# Patient Record
Sex: Male | Born: 1960
Health system: Southern US, Community
[De-identification: ages and names within clinical notes are randomized; demographics above are authoritative.]

## PROBLEM LIST (undated history)

## (undated) DIAGNOSIS — A630 Anogenital (venereal) warts: Secondary | ICD-10-CM

## (undated) DIAGNOSIS — F17200 Nicotine dependence, unspecified, uncomplicated: Secondary | ICD-10-CM

## (undated) DIAGNOSIS — I2510 Atherosclerotic heart disease of native coronary artery without angina pectoris: Secondary | ICD-10-CM

## (undated) DIAGNOSIS — Z9861 Coronary angioplasty status: Secondary | ICD-10-CM

## (undated) DIAGNOSIS — J449 Chronic obstructive pulmonary disease, unspecified: Secondary | ICD-10-CM

## (undated) DIAGNOSIS — Z6827 Body mass index (BMI) 27.0-27.9, adult: Secondary | ICD-10-CM

## (undated) DIAGNOSIS — G4733 Obstructive sleep apnea (adult) (pediatric): Secondary | ICD-10-CM

## (undated) DIAGNOSIS — E782 Mixed hyperlipidemia: Secondary | ICD-10-CM

## (undated) DIAGNOSIS — I10 Essential (primary) hypertension: Secondary | ICD-10-CM

## (undated) HISTORY — PX: NO PAST SURGERIES: SHX2092

## (undated) HISTORY — DX: Chronic obstructive pulmonary disease, unspecified: J44.9

## (undated) HISTORY — DX: Obstructive sleep apnea (adult) (pediatric): G47.33

## (undated) HISTORY — DX: Nicotine dependence, unspecified, uncomplicated: F17.200

## (undated) HISTORY — DX: Body mass index (BMI) 27.0-27.9, adult: Z68.27

## (undated) HISTORY — DX: Essential (primary) hypertension: I10

## (undated) HISTORY — DX: Anogenital (venereal) warts: A63.0

## (undated) HISTORY — DX: Atherosclerotic heart disease of native coronary artery without angina pectoris: I25.10

## (undated) HISTORY — DX: Coronary angioplasty status: Z98.61

## (undated) HISTORY — DX: Mixed hyperlipidemia: E78.2

---

## 2020-02-28 DIAGNOSIS — I2510 Atherosclerotic heart disease of native coronary artery without angina pectoris: Secondary | ICD-10-CM | POA: Diagnosis not present

## 2020-02-28 DIAGNOSIS — Z79899 Other long term (current) drug therapy: Secondary | ICD-10-CM | POA: Diagnosis not present

## 2020-02-28 DIAGNOSIS — Z9861 Coronary angioplasty status: Secondary | ICD-10-CM | POA: Diagnosis not present

## 2020-02-28 DIAGNOSIS — J449 Chronic obstructive pulmonary disease, unspecified: Secondary | ICD-10-CM | POA: Diagnosis not present

## 2020-02-28 DIAGNOSIS — E782 Mixed hyperlipidemia: Secondary | ICD-10-CM | POA: Diagnosis not present

## 2020-02-28 DIAGNOSIS — G4733 Obstructive sleep apnea (adult) (pediatric): Secondary | ICD-10-CM | POA: Diagnosis not present

## 2020-02-28 DIAGNOSIS — Z23 Encounter for immunization: Secondary | ICD-10-CM | POA: Diagnosis not present

## 2020-03-21 ENCOUNTER — Encounter: Payer: Self-pay | Admitting: General Practice

## 2020-04-13 DIAGNOSIS — A630 Anogenital (venereal) warts: Secondary | ICD-10-CM | POA: Insufficient documentation

## 2020-04-13 DIAGNOSIS — Z9861 Coronary angioplasty status: Secondary | ICD-10-CM | POA: Insufficient documentation

## 2020-04-13 DIAGNOSIS — J449 Chronic obstructive pulmonary disease, unspecified: Secondary | ICD-10-CM | POA: Insufficient documentation

## 2020-04-13 DIAGNOSIS — E782 Mixed hyperlipidemia: Secondary | ICD-10-CM | POA: Insufficient documentation

## 2020-04-13 DIAGNOSIS — I10 Essential (primary) hypertension: Secondary | ICD-10-CM | POA: Insufficient documentation

## 2020-04-13 DIAGNOSIS — F17200 Nicotine dependence, unspecified, uncomplicated: Secondary | ICD-10-CM | POA: Insufficient documentation

## 2020-04-13 DIAGNOSIS — Z6827 Body mass index (BMI) 27.0-27.9, adult: Secondary | ICD-10-CM | POA: Insufficient documentation

## 2020-04-13 DIAGNOSIS — I2510 Atherosclerotic heart disease of native coronary artery without angina pectoris: Secondary | ICD-10-CM | POA: Insufficient documentation

## 2020-04-13 DIAGNOSIS — G4733 Obstructive sleep apnea (adult) (pediatric): Secondary | ICD-10-CM | POA: Insufficient documentation

## 2020-04-14 ENCOUNTER — Other Ambulatory Visit: Payer: Self-pay

## 2020-04-14 ENCOUNTER — Encounter: Payer: Self-pay | Admitting: Cardiology

## 2020-04-14 ENCOUNTER — Ambulatory Visit (INDEPENDENT_AMBULATORY_CARE_PROVIDER_SITE_OTHER): Payer: BC Managed Care – PPO | Admitting: Cardiology

## 2020-04-14 VITALS — BP 132/90 | HR 86 | Ht 73.0 in | Wt 212.2 lb

## 2020-04-14 DIAGNOSIS — E782 Mixed hyperlipidemia: Secondary | ICD-10-CM | POA: Diagnosis not present

## 2020-04-14 DIAGNOSIS — I25119 Atherosclerotic heart disease of native coronary artery with unspecified angina pectoris: Secondary | ICD-10-CM

## 2020-04-14 DIAGNOSIS — I2510 Atherosclerotic heart disease of native coronary artery without angina pectoris: Secondary | ICD-10-CM | POA: Diagnosis not present

## 2020-04-14 DIAGNOSIS — Z9861 Coronary angioplasty status: Secondary | ICD-10-CM

## 2020-04-14 DIAGNOSIS — F17210 Nicotine dependence, cigarettes, uncomplicated: Secondary | ICD-10-CM

## 2020-04-14 DIAGNOSIS — Z6827 Body mass index (BMI) 27.0-27.9, adult: Secondary | ICD-10-CM

## 2020-04-14 DIAGNOSIS — I10 Essential (primary) hypertension: Secondary | ICD-10-CM | POA: Diagnosis not present

## 2020-04-14 DIAGNOSIS — J431 Panlobular emphysema: Secondary | ICD-10-CM

## 2020-04-14 DIAGNOSIS — Z951 Presence of aortocoronary bypass graft: Secondary | ICD-10-CM

## 2020-04-14 MED ORDER — ASPIRIN EC 81 MG PO TBEC: 81.0000 mg | DELAYED_RELEASE_TABLET | Freq: Every day | ORAL | 3 refills | Status: AC

## 2020-04-14 NOTE — Patient Instructions (Signed)
Medication Instructions:  Your physician has recommended you make the following change in your medication:   Take 81 mg aspirin daily.  *If you need a refill on your cardiac medications before your next appointment, please call your pharmacy*   Lab Work: None ordered If you have labs (blood work) drawn today and your tests are completely normal, you will receive your results only by: Marland Kitchen MyChart Message (if you have MyChart) OR . A paper copy in the mail If you have any lab test that is abnormal or we need to change your treatment, we will call you to review the results.   Testing/Procedures: Your physician has requested that you have an echocardiogram. Echocardiography is a painless test that uses sound waves to create images of your heart. It provides your doctor with information about the size and shape of your heart and how well your heart's chambers and valves are working. This procedure takes approximately one hour. There are no restrictions for this procedure.     Follow-Up: At [facility], you and your health needs are our priority.  As part of our continuing mission to provide you with exceptional heart care, we have created designated Provider Care Teams.  These Care Teams include your primary Cardiologist (physician) and Advanced Practice Providers (APPs -  Physician Assistants and Nurse Practitioners) who all work together to provide you with the care you need, when you need it.  We recommend signing up for the patient portal called "MyChart".  Sign up information is provided on this After Visit Summary.  MyChart is used to connect with patients for Virtual Visits (Telemedicine).  Patients are able to view lab/test results, encounter notes, upcoming appointments, etc.  Non-urgent messages can be sent to your provider as well.   To learn more about what you can do with MyChart, go to [email or web address].    Your next appointment:   6 month(s)  The format for your next  appointment:   In Person  Provider:   Belva Crome, MD   Other Instructions  Echocardiogram An echocardiogram is a test that uses sound waves (ultrasound) to produce images of the heart. Images from an echocardiogram can provide important information about:  Heart size and shape.  The size and thickness and movement of your heart's walls.  Heart muscle function and strength.  Heart valve function or if you have stenosis. Stenosis is when the heart valves are too narrow.  If blood is flowing backward through the heart valves (regurgitation).  A tumor or infectious growth around the heart valves.  Areas of heart muscle that are not working well because of poor blood flow or injury from a heart attack.  Aneurysm detection. An aneurysm is a weak or damaged part of an artery wall. The wall bulges out from the normal force of blood pumping through the body. Tell a health care provider about:  Any allergies you have.  All medicines you are taking, including vitamins, herbs, eye drops, creams, and over-the-counter medicines.  Any blood disorders you have.  Any surgeries you have had.  Any medical conditions you have.  Whether you are pregnant or may be pregnant. What are the risks? Generally, this is a safe test. However, problems may occur, including an allergic reaction to dye (contrast) that may be used during the test. What happens before the test? No specific preparation is needed. You may eat and drink normally. What happens during the test?  You will take off your clothes from the waist  up and put on a hospital gown.  Electrodes or electrocardiogram (ECG)patches may be placed on your chest. The electrodes or patches are then connected to a device that monitors your heart rate and rhythm.  You will lie down on a table for an ultrasound exam. A gel will be applied to your chest to help sound waves pass through your skin.  A handheld device, called a transducer,  will be pressed against your chest and moved over your heart. The transducer produces sound waves that travel to your heart and bounce back (or "echo" back) to the transducer. These sound waves will be captured in real-time and changed into images of your heart that can be viewed on a video monitor. The images will be recorded on a computer and reviewed by your health care provider.  You may be asked to change positions or hold your breath for a short time. This makes it easier to get different views or better views of your heart.  In some cases, you may receive contrast through an IV in one of your veins. This can improve the quality of the pictures from your heart. The procedure may vary among health care providers and hospitals.   What can I expect after the test? You may return to your normal, everyday life, including diet, activities, and medicines, unless your health care provider tells you not to do that. Follow these instructions at home:  It is up to you to get the results of your test. Ask your health care provider, or the department that is doing the test, when your results will be ready.  Keep all follow-up visits. This is important. Summary  An echocardiogram is a test that uses sound waves (ultrasound) to produce images of the heart.  Images from an echocardiogram can provide important information about the size and shape of your heart, heart muscle function, heart valve function, and other possible heart problems.  You do not need to do anything to prepare before this test. You may eat and drink normally.  After the echocardiogram is completed, you may return to your normal, everyday life, unless your health care provider tells you not to do that. This information is not intended to replace advice given to you by your health care provider. Make sure you discuss any questions you have with your health care provider. Document Revised: 08/24/2019 Document Reviewed:  08/24/2019 Elsevier Patient Education  2021 Elsevier Inc.    Aspirin and Your Heart Aspirin is a medicine that prevents the platelets in your blood from sticking together. Platelets are the cells that your blood uses for clotting. Aspirin can be used to help reduce the risk of blood clots, heart attacks, and other heart-related problems. What are the risks? Daily use of aspirin can cause side effects. Some of these include:  Bleeding. Bleeding can be minor or serious. An example of minor bleeding is bleeding from a cut, and the bleeding does not stop. An example of more serious bleeding is stomach bleeding or, rarely, bleeding into the brain. Your risk of bleeding increases if you are also taking NSAIDs, such as ibuprofen.  Increased bruising.  Upset stomach.  An allergic reaction. People who have growths inside the nose (nasal polyps) have an increased risk of developing an aspirin allergy. How to use aspirin to care for your heart  Take aspirin only as told by your health care provider. Make sure that you understand how much to take and what form to take. The two forms  of aspirin are: ? Non-enteric-coated.This type of aspirin does not have a coating and is absorbed quickly. This type of aspirin also comes in a chewable form. ? Enteric-coated. This type of aspirin has a coating that releases the medicine very slowly. Enteric-coated aspirin might cause less stomach upset than non-enteric-coated aspirin. This type of aspirin should not be chewed or crushed.  Work with your health care provider to find out whether it is safe and beneficial for you to take aspirin daily. Taking aspirin daily may be helpful if: ? You have had a heart attack or chest pain, or you are at risk for a heart attack. ? You have a condition in which certain heart vessels are blocked (coronary artery disease), and you have had a procedure to treat it. Examples are:  Open-heart surgery, such as coronary artery bypass  surgery (CABG).  Coronary angioplasty,which is done to widen a blood vessel of your heart.  Having a small mesh tube, or stent, placed in your coronary artery. ? You have had certain types of stroke or a mini-stroke known as a transient ischemic attack (TIA). ? You have a narrowing of the arteries that supply the limbs (peripheral artery disease, or PAD). ? You have long-term (chronic) heart rhythm problems, such as atrial fibrillation, and your health care provider thinks aspirin may help. ? You have valve disease or have had surgery on a valve. ? You are considered at increased risk of developing coronary artery disease or PAD.   Follow these instructions at home Medicines  Take over-the-counter and prescription medicines only as told by your health care provider.  If you are taking blood thinners: ? Talk with your health care provider before you take any medicines that contain aspirin or NSAIDs, such as ibuprofen. These medicines increase your risk for dangerous bleeding. ? Take your medicine exactly as told, at the same time every day. ? Avoid activities that could cause injury or bruising, and follow instructions about how to prevent falls. ? Wear a medical alert bracelet or carry a card that lists what medicines you take. General instructions  Do not drink alcohol if: ? Your health care provider tells you not to drink. ? You are pregnant, may be pregnant, or are planning to become pregnant.  If you drink alcohol: ? Limit how much you use to:  0-1 drink a day for women.  0-2 drinks a day for men. ? Be aware of how much alcohol is in your drink. In the [place] one drink equals one 12 oz bottle of beer (355 mL), one 5 oz glass of wine (148 mL), or one 1 oz glass of hard liquor (44 mL).  Keep all follow-up visits as told by your health care provider. This is important. Where to find more information  The American Heart Association: www.heart.org Contact a health care provider  if you have:  Unusual bleeding or bruising.  Stomach pain or nausea.  Ringing in your ears.  An allergic reaction that causes hives, itchy skin, or swelling of the lips, tongue, or face. Get help right away if:  You notice that your bowel movements are bloody, or dark red or black in color.  You vomit or cough up blood.  You have blood in your urine.  You cough, breathe loudly (wheeze), or feel short of breath.  You have chest pain, especially if the pain spreads to your arms, back, neck, or jaw.  You have a headache with confusion. You have any symptoms of  a stroke. "BE FAST" is an easy way to remember the main warning signs of a stroke:  B - Balance. Signs are dizziness, sudden trouble walking, or loss of balance.  E - Eyes. Signs are trouble seeing or a sudden change in vision.  F - Face. Signs are sudden weakness or numbness of the face, or the face or eyelid drooping on one side.  A - Arms. Signs are weakness or numbness in an arm. This happens suddenly and usually on one side of the body.  S - Speech. Signs are sudden trouble speaking, slurred speech, or trouble understanding what people say.  T - Time. Time to call emergency services. Write down what time symptoms started. You have other signs of a stroke, such as:  A sudden, severe headache with no known cause.  Nausea or vomiting.  Seizure. These symptoms may represent a serious problem that is an emergency. Do not wait to see if the symptoms will go away. Get medical help right away. Call your local emergency services (911 in the [place] Do not drive yourself to the hospital. Summary  Aspirin use can help reduce the risk of blood clots, heart attacks, and other heart-related problems.  Daily use of aspirin can cause side effects.  Take aspirin only as told by your health care provider. Make sure that you understand how much to take and what form to take.  Your health care provider will help you determine  whether it is safe and beneficial for you to take aspirin daily. This information is not intended to replace advice given to you by your health care provider. Make sure you discuss any questions you have with your health care provider. Document Revised: 10/05/2018 Document Reviewed: 10/05/2018 Elsevier Patient Education  2021 [employer]

## 2020-04-14 NOTE — Progress Notes (Signed)
Cardiology Office Note:    Date:  04/14/2020   ID:  Ryan Shaffer, DOB [birth year], MRN [id number]  PCP:  Julianne Handler, NP  Cardiologist:  Garwin Brothers, MD   Referring MD: Julianne Handler, NP    ASSESSMENT:    1. Coronary artery disease involving native heart with angina pectoris, unspecified vessel or lesion type (HCC)   2. Benign essential hypertension   3. Coronary artery disease with history of coronary revascularization   4. Mixed hyperlipidemia   5. BMI 27.0-27.9,adult   6. Panlobular emphysema (HCC)   7. Cigarette smoker   8. Hx of CABG    PLAN:    In order of problems listed above:  1. Coronary artery disease post CABG surgery: Secondary prevention stressed with the patient.  Importance of compliance with diet medication stressed any vocalized understanding.  Was advised to take a coated aspirin on a daily basis.  He was advised to take half an hour of exercise of walking and aerobic exercises every day 5 days a week and he promises to do so.  I will try to get his records from [place] where he was seen by his cardiologist.  He does have nitroglycerin and understands its protocol.  He has never used it.  Echocardiogram will be done to assess murmur heard on auscultation and his cardiac anatomy. 2. Cigarette smoker: I spent 5 minutes with the patient discussing solely about smoking. Smoking cessation was counseled. I suggested to the patient also different medications and pharmacological interventions. Patient is keen to try stopping on its own at this time. He will get back to me if he needs any further assistance in this matter. 3. Essential hypertension: Blood pressure stable and diet was emphasized.  Lifestyle modification urged. 4. Mixed dyslipidemia: Lipids were reviewed and they are followed by primary care.  I reviewed and discussed findings with the patient at length. 5. Sleep apnea: Sleep health issues were discussed.  Patient also gives history of COPD and smoking and  related issues were discussed and needs lipids and health were discussed extensively and he promises to quit. 6. Patient will be seen in follow-up appointment in 6 months or earlier if the patient has any concerns    Medication Adjustments/Labs and Tests Ordered: Current medicines are reviewed at length with the patient today.  Concerns regarding medicines are outlined above.  Orders Placed This Encounter  Procedures  . EKG 12-Lead  . ECHOCARDIOGRAM COMPLETE   Meds ordered this encounter  Medications  . aspirin EC 81 MG tablet    Sig: Take 1 tablet (81 mg total) by mouth daily. Swallow whole.    Dispense:  90 tablet    Refill:  3     History of Present Illness:    Ryan Shaffer is a 60 y.o. male who is being seen today for the evaluation of coronary artery disease to be established at the request of Julianne Handler, NP.  Patient is a pleasant 60 year old male.  He has past medical history of coronary artery disease post CABG surgery, essential hypertension, dyslipidemia and unfortunately smokes heavily.  He has had bypass surgery 6 years ago and has moved from [place] or here.  He denies any chest pain orthopnea or PND.  He takes care of activities of daily living.  He does not exercise on a regular basis.  At the time of my evaluation, the patient is alert awake oriented and in no distress.  Past Medical History:  Diagnosis Date  .  Benign essential hypertension   . BMI 27.0-27.9,adult   . CAD (coronary artery disease)   . COPD (chronic obstructive pulmonary disease) (HCC)   . Coronary artery disease with history of coronary revascularization   . Genital warts   . Mixed hyperlipidemia   . Nicotine dependence   . Obstructive sleep apnea, adult     Past Surgical History:  Procedure Laterality Date  . NO PAST SURGERIES      Current Medications: Current Meds  Medication Sig  . albuterol (VENTOLIN HFA) 108 (90 Base) MCG/ACT inhaler Inhale 2 puffs into the lungs every 4 (four)  hours as needed for wheezing or shortness of breath.  Marland Kitchen aspirin EC 81 MG tablet Take 1 tablet (81 mg total) by mouth daily. Swallow whole.  Marland Kitchen atorvastatin (LIPITOR) 80 MG tablet Take 80 mg by mouth daily.  . budesonide-formoterol (SYMBICORT) 160-4.5 MCG/ACT inhaler Inhale 1 puff into the lungs 2 (two) times daily.  . carvedilol (COREG) 6.25 MG tablet Take 6.25 mg by mouth 2 (two) times daily with a meal.  . imiquimod (ALDARA) 5 % cream Apply 1 application topically 3 (three) times a week.  Marland Kitchen lisinopril (ZESTRIL) 2.5 MG tablet Take 2.5 mg by mouth daily.  . nitroGLYCERIN (NITROSTAT) 0.4 MG SL tablet Place 0.4 mg under the tongue every 5 (five) minutes as needed for chest pain.  . [DISCONTINUED] Albuterol Sulfate, sensor, 108 (90 Base) MCG/ACT AEPB Inhale 1 puff into the lungs as needed for wheezing or shortness of breath.     Allergies:   Patient has no known allergies.   Social History   Socioeconomic History  . Marital status: Married    Spouse name: Not on file  . Number of children: Not on file  . Years of education: Not on file  . Highest education level: Not on file  Occupational History  . Not on file  Tobacco Use  . Smoking status: Current Every Day Smoker  . Smokeless tobacco: Never Used  Substance and Sexual Activity  . Alcohol use: Yes    Alcohol/week: 5.0 - 10.0 standard drinks    Types: 5 - 10 Cans of beer per week  . Drug use: Not on file  . Sexual activity: Not on file  Other Topics Concern  . Not on file  Social History Narrative  . Not on file   Social Determinants of Health   Financial Resource Strain: Not on file  Food Insecurity: Not on file  Transportation Needs: Not on file  Physical Activity: Not on file  Stress: Not on file  Social Connections: Not on file     Family History: The patient's family history includes Heart disease in his father; Hypertension in his father; Stroke in his father.  ROS:   Please see the history of present illness.     All other systems reviewed and are negative.  EKGs/Labs/Other Studies Reviewed:    The following studies were reviewed today: EKG reveals sinus rhythm and nonspecific ST-T changes   Recent Labs: No results found for requested labs within last 8760 hours.  Recent Lipid Panel No results found for: CHOL, TRIG, HDL, CHOLHDL, VLDL, LDLCALC, LDLDIRECT  Physical Exam:    VS:  BP 132/90   Pulse 86   Ht 6\' 1"  (1.854 m)   Wt 212 lb 3.2 oz (96.3 kg)   SpO2 98%   BMI 28.00 kg/m     Wt Readings from Last 3 Encounters:  04/14/20 212 lb 3.2 oz (96.3 kg)  GEN: Patient is in no acute distress HEENT: Normal NECK: No JVD; No carotid bruits LYMPHATICS: No lymphadenopathy CARDIAC: S1 S2 regular, 2/6 systolic murmur at the apex. RESPIRATORY:  Clear to auscultation without rales, wheezing or rhonchi  ABDOMEN: Soft, non-tender, non-distended MUSCULOSKELETAL:  No edema; No deformity  SKIN: Warm and dry NEUROLOGIC:  Alert and oriented x 3 PSYCHIATRIC:  Normal affect    Signed, Garwin Brothers, MD  04/14/2020 9:23 AM    [facility]

## 2020-05-10 ENCOUNTER — Other Ambulatory Visit: Payer: BC Managed Care – PPO

## 2020-06-07 ENCOUNTER — Other Ambulatory Visit: Payer: BC Managed Care – PPO

## 2020-08-09 DIAGNOSIS — G4733 Obstructive sleep apnea (adult) (pediatric): Secondary | ICD-10-CM | POA: Diagnosis not present

## 2020-08-19 DIAGNOSIS — G4733 Obstructive sleep apnea (adult) (pediatric): Secondary | ICD-10-CM | POA: Diagnosis not present

## 2020-08-19 DIAGNOSIS — R0602 Shortness of breath: Secondary | ICD-10-CM | POA: Diagnosis not present

## 2020-08-20 DIAGNOSIS — G4733 Obstructive sleep apnea (adult) (pediatric): Secondary | ICD-10-CM | POA: Diagnosis not present

## 2020-08-20 DIAGNOSIS — R0602 Shortness of breath: Secondary | ICD-10-CM | POA: Diagnosis not present

## 2020-09-09 DIAGNOSIS — G4733 Obstructive sleep apnea (adult) (pediatric): Secondary | ICD-10-CM | POA: Diagnosis not present

## 2020-09-12 DIAGNOSIS — I252 Old myocardial infarction: Secondary | ICD-10-CM | POA: Diagnosis not present

## 2020-09-12 DIAGNOSIS — J449 Chronic obstructive pulmonary disease, unspecified: Secondary | ICD-10-CM | POA: Diagnosis not present

## 2020-09-12 DIAGNOSIS — I10 Essential (primary) hypertension: Secondary | ICD-10-CM | POA: Diagnosis not present

## 2020-09-12 DIAGNOSIS — E782 Mixed hyperlipidemia: Secondary | ICD-10-CM | POA: Diagnosis not present

## 2020-10-10 DIAGNOSIS — G4733 Obstructive sleep apnea (adult) (pediatric): Secondary | ICD-10-CM | POA: Diagnosis not present

## 2020-11-09 DIAGNOSIS — G4733 Obstructive sleep apnea (adult) (pediatric): Secondary | ICD-10-CM | POA: Diagnosis not present

## 2020-12-10 DIAGNOSIS — G4733 Obstructive sleep apnea (adult) (pediatric): Secondary | ICD-10-CM | POA: Diagnosis not present

## 2021-01-09 DIAGNOSIS — G4733 Obstructive sleep apnea (adult) (pediatric): Secondary | ICD-10-CM | POA: Diagnosis not present

## 2021-01-26 DIAGNOSIS — I2510 Atherosclerotic heart disease of native coronary artery without angina pectoris: Secondary | ICD-10-CM | POA: Diagnosis not present

## 2021-01-26 DIAGNOSIS — Z131 Encounter for screening for diabetes mellitus: Secondary | ICD-10-CM | POA: Diagnosis not present

## 2021-01-26 DIAGNOSIS — I10 Essential (primary) hypertension: Secondary | ICD-10-CM | POA: Diagnosis not present

## 2021-01-26 DIAGNOSIS — I252 Old myocardial infarction: Secondary | ICD-10-CM | POA: Diagnosis not present

## 2021-01-26 DIAGNOSIS — E785 Hyperlipidemia, unspecified: Secondary | ICD-10-CM | POA: Diagnosis not present

## 2021-01-26 DIAGNOSIS — Z1331 Encounter for screening for depression: Secondary | ICD-10-CM | POA: Diagnosis not present

## 2021-01-26 DIAGNOSIS — Z0000 Encounter for general adult medical examination without abnormal findings: Secondary | ICD-10-CM | POA: Diagnosis not present

## 2021-01-26 DIAGNOSIS — Z87891 Personal history of nicotine dependence: Secondary | ICD-10-CM | POA: Diagnosis not present

## 2021-01-26 DIAGNOSIS — J069 Acute upper respiratory infection, unspecified: Secondary | ICD-10-CM | POA: Diagnosis not present

## 2021-01-26 DIAGNOSIS — Z6826 Body mass index (BMI) 26.0-26.9, adult: Secondary | ICD-10-CM | POA: Diagnosis not present

## 2021-02-06 DIAGNOSIS — I252 Old myocardial infarction: Secondary | ICD-10-CM | POA: Diagnosis not present

## 2021-02-06 DIAGNOSIS — J449 Chronic obstructive pulmonary disease, unspecified: Secondary | ICD-10-CM | POA: Diagnosis not present

## 2021-02-06 DIAGNOSIS — I10 Essential (primary) hypertension: Secondary | ICD-10-CM | POA: Diagnosis not present

## 2021-02-06 DIAGNOSIS — E785 Hyperlipidemia, unspecified: Secondary | ICD-10-CM | POA: Diagnosis not present

## 2021-02-20 DIAGNOSIS — J441 Chronic obstructive pulmonary disease with (acute) exacerbation: Secondary | ICD-10-CM | POA: Diagnosis not present

## 2021-02-20 DIAGNOSIS — J069 Acute upper respiratory infection, unspecified: Secondary | ICD-10-CM | POA: Diagnosis not present

## 2021-02-27 DIAGNOSIS — J449 Chronic obstructive pulmonary disease, unspecified: Secondary | ICD-10-CM | POA: Diagnosis not present

## 2021-02-27 DIAGNOSIS — J441 Chronic obstructive pulmonary disease with (acute) exacerbation: Secondary | ICD-10-CM | POA: Diagnosis not present

## 2021-02-27 DIAGNOSIS — F17200 Nicotine dependence, unspecified, uncomplicated: Secondary | ICD-10-CM | POA: Diagnosis not present

## 2021-02-27 DIAGNOSIS — J069 Acute upper respiratory infection, unspecified: Secondary | ICD-10-CM | POA: Diagnosis not present

## 2021-03-19 DIAGNOSIS — J9811 Atelectasis: Secondary | ICD-10-CM | POA: Diagnosis not present

## 2021-03-19 DIAGNOSIS — R918 Other nonspecific abnormal finding of lung field: Secondary | ICD-10-CM | POA: Diagnosis not present

## 2021-03-19 DIAGNOSIS — I700 Atherosclerosis of aorta: Secondary | ICD-10-CM | POA: Diagnosis not present

## 2021-03-19 DIAGNOSIS — R0602 Shortness of breath: Secondary | ICD-10-CM | POA: Diagnosis not present

## 2021-04-19 ENCOUNTER — Other Ambulatory Visit: Payer: Self-pay | Admitting: Internal Medicine

## 2021-04-20 ENCOUNTER — Other Ambulatory Visit: Payer: Self-pay | Admitting: Internal Medicine

## 2021-04-20 DIAGNOSIS — R9389 Abnormal findings on diagnostic imaging of other specified body structures: Secondary | ICD-10-CM

## 2021-04-25 ENCOUNTER — Other Ambulatory Visit: Payer: BC Managed Care – PPO

## 2021-04-25 ENCOUNTER — Ambulatory Visit
Admission: RE | Admit: 2021-04-25 | Discharge: 2021-04-25 | Disposition: A | Discharge status: Home / Self Care | Payer: BC Managed Care – PPO | Source: Ambulatory Visit | Attending: Internal Medicine | Admitting: Internal Medicine

## 2021-04-25 DIAGNOSIS — R9389 Abnormal findings on diagnostic imaging of other specified body structures: Secondary | ICD-10-CM

## 2021-04-25 DIAGNOSIS — K8020 Calculus of gallbladder without cholecystitis without obstruction: Secondary | ICD-10-CM | POA: Diagnosis not present

## 2021-04-25 DIAGNOSIS — K7689 Other specified diseases of liver: Secondary | ICD-10-CM | POA: Diagnosis not present

## 2021-04-25 MED ORDER — GADOBENATE DIMEGLUMINE 529 MG/ML IV SOLN
19.0000 mL | Freq: Once | INTRAVENOUS | Status: AC | PRN
  Administered 2021-04-25: 19 mL via INTRAVENOUS

## 2021-04-27 ENCOUNTER — Other Ambulatory Visit: Payer: BC Managed Care – PPO

## 2021-05-22 DIAGNOSIS — R935 Abnormal findings on diagnostic imaging of other abdominal regions, including retroperitoneum: Secondary | ICD-10-CM | POA: Diagnosis not present

## 2021-05-22 DIAGNOSIS — K8020 Calculus of gallbladder without cholecystitis without obstruction: Secondary | ICD-10-CM | POA: Diagnosis not present

## 2021-05-22 DIAGNOSIS — R350 Frequency of micturition: Secondary | ICD-10-CM | POA: Diagnosis not present

## 2021-05-22 DIAGNOSIS — J449 Chronic obstructive pulmonary disease, unspecified: Secondary | ICD-10-CM | POA: Diagnosis not present

## 2021-09-14 DIAGNOSIS — R350 Frequency of micturition: Secondary | ICD-10-CM | POA: Diagnosis not present

## 2021-09-14 DIAGNOSIS — R300 Dysuria: Secondary | ICD-10-CM | POA: Diagnosis not present

## 2021-09-21 DIAGNOSIS — R972 Elevated prostate specific antigen [PSA]: Secondary | ICD-10-CM | POA: Diagnosis not present

## 2021-09-21 DIAGNOSIS — N401 Enlarged prostate with lower urinary tract symptoms: Secondary | ICD-10-CM | POA: Diagnosis not present

## 2021-09-21 DIAGNOSIS — N410 Acute prostatitis: Secondary | ICD-10-CM | POA: Diagnosis not present

## 2021-09-21 DIAGNOSIS — J449 Chronic obstructive pulmonary disease, unspecified: Secondary | ICD-10-CM | POA: Diagnosis not present

## 2021-09-21 DIAGNOSIS — N453 Epididymo-orchitis: Secondary | ICD-10-CM | POA: Diagnosis not present

## 2021-09-21 DIAGNOSIS — I10 Essential (primary) hypertension: Secondary | ICD-10-CM | POA: Diagnosis not present

## 2021-09-21 DIAGNOSIS — N400 Enlarged prostate without lower urinary tract symptoms: Secondary | ICD-10-CM | POA: Diagnosis not present

## 2021-10-24 ENCOUNTER — Emergency Department (HOSPITAL_COMMUNITY): Payer: BC Managed Care – PPO

## 2021-10-24 ENCOUNTER — Encounter (HOSPITAL_COMMUNITY): Payer: Self-pay

## 2021-10-24 ENCOUNTER — Other Ambulatory Visit: Payer: Self-pay

## 2021-10-24 ENCOUNTER — Emergency Department (HOSPITAL_COMMUNITY)
Admission: EM | Admit: 2021-10-24 | Discharge: 2021-10-24 | Discharge status: Against Medical Advice | Payer: BC Managed Care – PPO | Attending: Emergency Medicine | Admitting: Emergency Medicine

## 2021-10-24 DIAGNOSIS — Z5321 Procedure and treatment not carried out due to patient leaving prior to being seen by health care provider: Secondary | ICD-10-CM | POA: Diagnosis not present

## 2021-10-24 DIAGNOSIS — R0789 Other chest pain: Secondary | ICD-10-CM | POA: Diagnosis not present

## 2021-10-24 DIAGNOSIS — Z7982 Long term (current) use of aspirin: Secondary | ICD-10-CM | POA: Insufficient documentation

## 2021-10-24 DIAGNOSIS — R079 Chest pain, unspecified: Secondary | ICD-10-CM | POA: Insufficient documentation

## 2021-10-24 DIAGNOSIS — J9811 Atelectasis: Secondary | ICD-10-CM | POA: Diagnosis not present

## 2021-10-24 LAB — BASIC METABOLIC PANEL
Anion gap: 7 (ref 5–15)
BUN: 21 mg/dL — ABNORMAL HIGH (ref 6–20)
CO2: 26 mmol/L (ref 22–32)
Calcium: 9 mg/dL (ref 8.9–10.3)
Chloride: 106 mmol/L (ref 98–111)
Creatinine, Ser: 1.03 mg/dL (ref 0.61–1.24)
GFR, Estimated: >60 mL/min (ref >60)
Glucose, Bld: 97 mg/dL (ref 70–99)
Potassium: 4.4 mmol/L (ref 3.5–5.1)
Sodium: 139 mmol/L (ref 135–145)

## 2021-10-24 LAB — CBC
HCT: 39.4 % (ref 39.0–52.0)
Hemoglobin: 13.3 g/dL (ref 13.0–17.0)
MCH: 30.8 pg (ref 26.0–34.0)
MCHC: 33.8 g/dL (ref 30.0–36.0)
MCV: 91.2 fL (ref 80.0–100.0)
Platelets: 213 10*3/uL (ref 150–400)
RBC: 4.32 MIL/uL (ref 4.22–5.81)
RDW: 13.8 % (ref 11.5–15.5)
WBC: 9.7 10*3/uL (ref 4.0–10.5)
nRBC: 0 % (ref 0.0–0.2)

## 2021-10-24 LAB — TROPONIN I (HIGH SENSITIVITY): Troponin I (High Sensitivity): 4 ng/L (ref <18)

## 2021-10-24 NOTE — ED Triage Notes (Signed)
Complains of chest pains when he was on the way to work.  Took nitro went away and then came back.  Patient has cardiac hx.  No radiation Denies sob n/v

## 2021-10-24 NOTE — ED Provider Triage Note (Signed)
Emergency Medicine Provider Triage Evaluation Note  Johnn Krasowski , a 61 y.o. male  was evaluated in triage.  Pt complains of chest pain onset on the way to work today.  He has had a total of 2 episodes of chest pain each lasting around 10 minutes and resolving with nitroglycerin.  He took 1 baby aspirin this morning and he was given additional 3 by EMS.  Patient reports a history of stent placement x2  Review of Systems  Positive: Chest pain Negative: Nausea vomiting, extremity swelling, or any additional concerns  Physical Exam  BP 115/80 (BP Location: Right Arm)   Pulse 79   Temp 97.9 F (36.6 C)   Resp 16   Ht 6\' 1"  (1.854 m)   Wt 90.7 kg   SpO2 100%   BMI 26.39 kg/m  Gen:   Awake, no distress   Resp:  Normal effort, lungs clear MSK:   Moves extremities without difficulty, no edema Other:  Heart regular rate and rhythm  Medical Decision Making  Medically screening exam initiated at 10:37 AM.  Appropriate orders placed.  Fredrico Beedle was informed that the remainder of the evaluation will be completed by another provider, this initial triage assessment does not replace that evaluation, and the importance of remaining in the ED until their evaluation is complete.    Note: Portions of this report may have been transcribed using voice recognition software. Every effort was made to ensure accuracy; however, inadvertent computerized transcription errors may still be present.    Deliah Boston, PA-C 10/24/21 1136

## 2021-10-24 NOTE — ED Notes (Signed)
Pt stated that he was leaving, he could not wait and he looked at his mychart. IV removed.

## 2021-10-26 DIAGNOSIS — N401 Enlarged prostate with lower urinary tract symptoms: Secondary | ICD-10-CM | POA: Diagnosis not present

## 2021-10-26 DIAGNOSIS — R972 Elevated prostate specific antigen [PSA]: Secondary | ICD-10-CM | POA: Diagnosis not present

## 2021-10-26 DIAGNOSIS — N453 Epididymo-orchitis: Secondary | ICD-10-CM | POA: Diagnosis not present

## 2021-12-13 ENCOUNTER — Other Ambulatory Visit: Payer: Self-pay

## 2021-12-17 ENCOUNTER — Other Ambulatory Visit: Payer: Self-pay

## 2021-12-17 ENCOUNTER — Encounter: Payer: Self-pay | Admitting: Internal Medicine

## 2021-12-17 ENCOUNTER — Ambulatory Visit (INDEPENDENT_AMBULATORY_CARE_PROVIDER_SITE_OTHER): Payer: BC Managed Care – PPO | Admitting: Internal Medicine

## 2021-12-17 VITALS — BP 130/90 | HR 77 | Temp 97.7°F | Resp 18 | Ht 73.0 in | Wt 214.1 lb

## 2021-12-17 DIAGNOSIS — E782 Mixed hyperlipidemia: Secondary | ICD-10-CM | POA: Diagnosis not present

## 2021-12-17 DIAGNOSIS — I10 Essential (primary) hypertension: Secondary | ICD-10-CM | POA: Diagnosis not present

## 2021-12-17 DIAGNOSIS — I2510 Atherosclerotic heart disease of native coronary artery without angina pectoris: Secondary | ICD-10-CM | POA: Diagnosis not present

## 2021-12-17 DIAGNOSIS — R5383 Other fatigue: Secondary | ICD-10-CM | POA: Insufficient documentation

## 2021-12-17 DIAGNOSIS — Z9861 Coronary angioplasty status: Secondary | ICD-10-CM

## 2021-12-17 MED ORDER — NITROGLYCERIN 0.4 MG SL SUBL: 0.4000 mg | SUBLINGUAL_TABLET | SUBLINGUAL | 2 refills | Status: AC | PRN

## 2021-12-17 MED ORDER — CARVEDILOL 6.25 MG PO TABS: 6.2500 mg | ORAL_TABLET | Freq: Two times a day (BID) | ORAL | 2 refills | Status: DC

## 2021-12-17 MED ORDER — MONTELUKAST SODIUM 10 MG PO TABS: 10.0000 mg | ORAL_TABLET | Freq: Every day | ORAL | 2 refills | Status: DC

## 2021-12-17 MED ORDER — TRAZODONE HCL 100 MG PO TABS: 100.0000 mg | ORAL_TABLET | Freq: Every day | ORAL | 3 refills | Status: DC

## 2021-12-17 MED ORDER — LISINOPRIL 2.5 MG PO TABS: 2.5000 mg | ORAL_TABLET | Freq: Every day | ORAL | 3 refills | Status: DC

## 2021-12-17 NOTE — Progress Notes (Signed)
   Established Patient Office Visit  Subjective   Patient ID: Ryan Shaffer, male    DOB: 1960-12-01  Age: 61 y.o. MRN: [id number]  Chief Complaint  Patient presents with   Follow-up   Hypertension    Go over Blood Pressure Medication    Hypertension The current episode started more than 1 year ago. Associated symptoms include malaise/fatigue. Risk factors for coronary artery disease include male gender and dyslipidemia.      Review of Systems  Constitutional:  Positive for malaise/fatigue.  Respiratory:  Positive for cough and sputum production.   Cardiovascular: Negative.   Gastrointestinal: Negative.   Neurological: Negative.       Objective:     BP (!) 130/90 (BP Location: Left Arm, Patient Position: Sitting, Cuff Size: Normal)   Pulse 77   Temp 97.7 F (36.5 C)   Resp 18   Ht 6\' 1"  (1.854 m)   Wt 214 lb 2 oz (97.1 kg)   SpO2 97%   BMI 28.25 kg/m    Physical Exam HENT:     Head: Normocephalic and atraumatic.  Eyes:     Extraocular Movements: Extraocular movements intact.     Pupils: Pupils are equal, round, and reactive to light.  Cardiovascular:     Rate and Rhythm: Regular rhythm.     Pulses: Normal pulses.     Heart sounds: Normal heart sounds.  Pulmonary:     Effort: Pulmonary effort is normal.     Breath sounds: Normal breath sounds.  Abdominal:     General: Bowel sounds are normal.     Palpations: Abdomen is soft.  Musculoskeletal:     Cervical back: Neck supple.  Neurological:     General: No focal deficit present.     Mental Status: He is alert and oriented to person, place, and time.      No results found for any visits on 12/17/21.    The ASCVD Risk score (Arnett DK, et al., 2019) failed to calculate for the following reasons:   Cannot find a previous HDL lab   Cannot find a previous total cholesterol lab    Assessment & Plan:   Problem List Items Addressed This Visit   None   No follow-ups on file.    2020, MD

## 2021-12-24 DIAGNOSIS — R55 Syncope and collapse: Secondary | ICD-10-CM | POA: Diagnosis not present

## 2021-12-24 DIAGNOSIS — N281 Cyst of kidney, acquired: Secondary | ICD-10-CM | POA: Diagnosis not present

## 2021-12-24 DIAGNOSIS — N451 Epididymitis: Secondary | ICD-10-CM | POA: Diagnosis not present

## 2021-12-24 DIAGNOSIS — N410 Acute prostatitis: Secondary | ICD-10-CM | POA: Diagnosis not present

## 2021-12-24 DIAGNOSIS — R319 Hematuria, unspecified: Secondary | ICD-10-CM | POA: Diagnosis not present

## 2021-12-24 DIAGNOSIS — K8020 Calculus of gallbladder without cholecystitis without obstruction: Secondary | ICD-10-CM | POA: Diagnosis not present

## 2021-12-24 DIAGNOSIS — N50811 Right testicular pain: Secondary | ICD-10-CM | POA: Diagnosis not present

## 2021-12-24 DIAGNOSIS — R519 Headache, unspecified: Secondary | ICD-10-CM | POA: Diagnosis not present

## 2021-12-24 DIAGNOSIS — N3001 Acute cystitis with hematuria: Secondary | ICD-10-CM | POA: Diagnosis not present

## 2021-12-24 DIAGNOSIS — N503 Cyst of epididymis: Secondary | ICD-10-CM | POA: Diagnosis not present

## 2021-12-24 DIAGNOSIS — N433 Hydrocele, unspecified: Secondary | ICD-10-CM | POA: Diagnosis not present

## 2021-12-24 DIAGNOSIS — N5089 Other specified disorders of the male genital organs: Secondary | ICD-10-CM | POA: Diagnosis not present

## 2021-12-24 DIAGNOSIS — R410 Disorientation, unspecified: Secondary | ICD-10-CM | POA: Diagnosis not present

## 2021-12-25 ENCOUNTER — Other Ambulatory Visit: Payer: Self-pay | Admitting: Internal Medicine

## 2022-01-09 ENCOUNTER — Other Ambulatory Visit: Payer: Self-pay | Admitting: Internal Medicine

## 2022-01-21 DIAGNOSIS — J449 Chronic obstructive pulmonary disease, unspecified: Secondary | ICD-10-CM | POA: Diagnosis not present

## 2022-01-21 DIAGNOSIS — Z20822 Contact with and (suspected) exposure to covid-19: Secondary | ICD-10-CM | POA: Diagnosis not present

## 2022-01-21 DIAGNOSIS — J069 Acute upper respiratory infection, unspecified: Secondary | ICD-10-CM | POA: Diagnosis not present

## 2022-02-11 DIAGNOSIS — L820 Inflamed seborrheic keratosis: Secondary | ICD-10-CM | POA: Diagnosis not present

## 2022-02-14 DIAGNOSIS — N401 Enlarged prostate with lower urinary tract symptoms: Secondary | ICD-10-CM | POA: Diagnosis not present

## 2022-02-14 DIAGNOSIS — R972 Elevated prostate specific antigen [PSA]: Secondary | ICD-10-CM | POA: Diagnosis not present

## 2022-02-14 DIAGNOSIS — C61 Malignant neoplasm of prostate: Secondary | ICD-10-CM | POA: Diagnosis not present

## 2022-02-14 DIAGNOSIS — R310 Gross hematuria: Secondary | ICD-10-CM | POA: Diagnosis not present

## 2022-03-14 ENCOUNTER — Other Ambulatory Visit: Payer: Self-pay | Admitting: Internal Medicine

## 2022-03-15 ENCOUNTER — Other Ambulatory Visit: Payer: Self-pay | Admitting: Internal Medicine

## 2022-03-22 ENCOUNTER — Ambulatory Visit: Payer: BC Managed Care – PPO | Admitting: Internal Medicine

## 2022-04-23 DIAGNOSIS — C61 Malignant neoplasm of prostate: Secondary | ICD-10-CM | POA: Diagnosis not present

## 2022-04-23 DIAGNOSIS — R972 Elevated prostate specific antigen [PSA]: Secondary | ICD-10-CM | POA: Diagnosis not present

## 2022-04-23 DIAGNOSIS — N401 Enlarged prostate with lower urinary tract symptoms: Secondary | ICD-10-CM | POA: Diagnosis not present

## 2022-04-29 DIAGNOSIS — C61 Malignant neoplasm of prostate: Secondary | ICD-10-CM | POA: Diagnosis not present

## 2022-04-29 DIAGNOSIS — N401 Enlarged prostate with lower urinary tract symptoms: Secondary | ICD-10-CM | POA: Diagnosis not present

## 2022-04-29 DIAGNOSIS — R972 Elevated prostate specific antigen [PSA]: Secondary | ICD-10-CM | POA: Diagnosis not present

## 2022-05-07 DIAGNOSIS — C61 Malignant neoplasm of prostate: Secondary | ICD-10-CM | POA: Diagnosis not present

## 2022-05-07 DIAGNOSIS — N281 Cyst of kidney, acquired: Secondary | ICD-10-CM | POA: Diagnosis not present

## 2022-05-07 DIAGNOSIS — I700 Atherosclerosis of aorta: Secondary | ICD-10-CM | POA: Diagnosis not present

## 2022-05-07 DIAGNOSIS — K8020 Calculus of gallbladder without cholecystitis without obstruction: Secondary | ICD-10-CM | POA: Diagnosis not present

## 2022-05-07 DIAGNOSIS — I2510 Atherosclerotic heart disease of native coronary artery without angina pectoris: Secondary | ICD-10-CM | POA: Diagnosis not present

## 2022-05-20 DIAGNOSIS — R310 Gross hematuria: Secondary | ICD-10-CM | POA: Diagnosis not present

## 2022-05-20 DIAGNOSIS — C61 Malignant neoplasm of prostate: Secondary | ICD-10-CM | POA: Diagnosis not present

## 2022-05-20 DIAGNOSIS — N401 Enlarged prostate with lower urinary tract symptoms: Secondary | ICD-10-CM | POA: Diagnosis not present

## 2022-05-20 DIAGNOSIS — R972 Elevated prostate specific antigen [PSA]: Secondary | ICD-10-CM | POA: Diagnosis not present

## 2022-07-17 ENCOUNTER — Encounter: Payer: Self-pay | Admitting: Internal Medicine

## 2022-07-17 ENCOUNTER — Ambulatory Visit: Payer: BC Managed Care – PPO | Admitting: Internal Medicine

## 2022-07-17 VITALS — BP 120/88 | HR 88 | Temp 98.4°F | Resp 16 | Ht 72.0 in | Wt 225.4 lb

## 2022-07-17 DIAGNOSIS — E7800 Pure hypercholesterolemia, unspecified: Secondary | ICD-10-CM

## 2022-07-17 DIAGNOSIS — J449 Chronic obstructive pulmonary disease, unspecified: Secondary | ICD-10-CM | POA: Diagnosis not present

## 2022-07-17 DIAGNOSIS — G4733 Obstructive sleep apnea (adult) (pediatric): Secondary | ICD-10-CM

## 2022-07-17 DIAGNOSIS — I10 Essential (primary) hypertension: Secondary | ICD-10-CM

## 2022-07-17 DIAGNOSIS — I2510 Atherosclerotic heart disease of native coronary artery without angina pectoris: Secondary | ICD-10-CM

## 2022-07-17 MED ORDER — PANTOPRAZOLE SODIUM 40 MG PO TBEC: 40.0000 mg | DELAYED_RELEASE_TABLET | Freq: Every day | ORAL | 3 refills | Status: DC

## 2022-07-17 NOTE — Assessment & Plan Note (Signed)
He is not able to tolerate his sleep mask or cannula.  I want him to work on his weight.

## 2022-07-17 NOTE — Assessment & Plan Note (Signed)
He denies any angina and his CAD is stable.  We will continue risk factor modification.  Thankfully he stopped smoking and I congratulated him.  He never went for the ECHO in 04/2020 and I am going to refer him back to cardiology.  Continue on his current meds including his ASA.

## 2022-07-17 NOTE — Progress Notes (Signed)
Office Visit  Subjective   Patient ID: Ryan Shaffer   DOB: October 16, 1960   Age: 62 y.o.   MRN: [id number]   Chief Complaint Chief Complaint  Patient presents with   Follow-up     History of Present Illness The patient is a 62 year old Caucasian/White male who returns for followup of his COPD.  He states that he was diagnosed 7-8 years by [facility].  He has a history of tobacco abuse where he started smoking at age 25 and smoked about 1 ppd.  He did start cutting back where 1 year ago he was smoking maybe 2 cigarettes per day.  He quit smoking in May and is currently on a nicotine patch.   He is currently on symbicort inhaler which he is supposed to take twice a day but he takes it once per day.  He is also on albuterol which he may use 2-3 times per week.  He has no baseline symptoms of COPD.  He states he can climb a flight of stairs and may get some SOB and he does have wheezing at night.  The patient's condition is controlled by medications as summarized in the medication list on the face sheet. He has the following modifiable risk factors: history of smoking. Specifically denied complaints: worsening exertional dyspnea, increased wheezing, change in color of sputum, pleuritic chest pain, hemoptysis, and fever.   The patient also reports history of sleep apnea.  He was diagnosed with OSA in [place] about 5 years ago.  The following additional complaints are also reported: he does not tolerate either full mask or nasal mask and has not been using his machine. He has seen the DME company and these were his only options it seems. There is no reported history of frequent daytime napping and a recent automobile accident. The patient's past medical history is notable for hypertension and COPD and MI.Marland Kitchen His social history is is notable for smoking. A sleep study was performed on 05/09/2020 and demonstrated obstructive apnea. The patient's lowest level of oxygen desaturation was 88 %. His  AHI/REI is 42.9 and ODI is 26.0. The respiratory disturbance index (RDI) was 47.3. Prior treatments include: nasal CPAP.   The patient is a 62 year old Caucasian/White male who presents for a follow-up evaluation of hypertension.  Since his last visit, he has not had any problems.  The patient has not been checking his blood pressure at home. The patient's current medications include: carvedilol 6.25 mg BID and lisinopril 2.5 mg daily. The patient has been tolerating his medications well. The patient denies any visual changes, lightheadness, chest pain, shortness of breath, and weakness/numbness. He reports there have been no other symptoms noted.   This patient also has a history of CAD where he had a MI in 2015 and he underwent cardiac catherization with stenting.  He was followed by cardiology with his last visit in 04/2020.  He saw Dr. Josiah Lobo in 2022 where they noted he had a CABG but the patient has never had a CABG.  He was told this was a mild heart attack.  He tells me that he did not go into heart failure.  Cardiology was planning to do an ECHO when they saw him in 04/2020 but I do not see these results in [facility] or at [facility].  He presents today for a status visit. See PMH for summary of cardiac history and status of revascularization. Her CAD is controlled with therapy as summarized  in the medication list and previous notes. The patient has comorbid conditions including HTN, HCL, OSA and history of tobacco abuse.  He has the following baseline symptoms: exertional dyspnea, chest pain, peripheral edema and palpitations. He has the following modifiable risk factor(s): HTN, DM, hyperlipidemia, and sedentary lifestyle. Specifically denied complaint(s): chest pain, palpitations, orthopnea, edema, exertional dyspnea, and syncope. He is currently on coreg, lisinopril, lipitor and ASA 81mg  daily.   The patient also has a history of high cholesterol when he was admitted for his MI in 73. Overall,  he states he is doing well and is without any complaints or problems at this time. He specifically denies abdominal pain, nausea, vomiting, diarrhea, myalgias, and fatigue.  He remains on dietary management as well as lipitor 80mg  qhs. She is fasting in anticipation for labs today.      Past Medical History Past Medical History:  Diagnosis Date   Benign essential hypertension    BMI 27.0-27.9,adult    CAD (coronary artery disease)    COPD (chronic obstructive pulmonary disease) (HCC)    Coronary artery disease with history of coronary revascularization    Genital warts    Mixed hyperlipidemia    Nicotine dependence    Obstructive sleep apnea, adult      Allergies No Known Allergies   Medications  Current Outpatient Medications:    finasteride (PROSCAR) 5 MG tablet, Take 5 mg by mouth daily., Disp: , Rfl:    albuterol (VENTOLIN HFA) 108 (90 Base) MCG/ACT inhaler, Inhale 2 puffs into the lungs every 4 (four) hours as needed for wheezing or shortness of breath., Disp: , Rfl:    aspirin EC 81 MG tablet, Take 1 tablet (81 mg total) by mouth daily. Swallow whole., Disp: 90 tablet, Rfl: 3   atorvastatin (LIPITOR) 80 MG tablet, Take 80 mg by mouth daily., Disp: , Rfl:    budesonide-formoterol (SYMBICORT) 160-4.5 MCG/ACT inhaler, Inhale 1 puff into the lungs 2 (two) times daily., Disp: , Rfl:    carvedilol (COREG) 6.25 MG tablet, TAKE 1 TABLET BY MOUTH 2 TIMES DAILY WITH A MEAL., Disp: 180 tablet, Rfl: 1   imiquimod (ALDARA) 5 % cream, Apply 1 application topically 3 (three) times a week., Disp: , Rfl:    lisinopril (ZESTRIL) 2.5 MG tablet, TAKE 1 TABLET BY MOUTH EVERY DAY, Disp: 90 tablet, Rfl: 1   montelukast (SINGULAIR) 10 MG tablet, TAKE 1 TABLET BY MOUTH EVERY DAY, Disp: 90 tablet, Rfl: 1   nitroGLYCERIN (NITROSTAT) 0.4 MG SL tablet, Place 1 tablet (0.4 mg total) under the tongue every 5 (five) minutes as needed for chest pain., Disp: 30 tablet, Rfl: 2   traZODone (DESYREL) 100 MG  tablet, Take 1 tablet (100 mg total) by mouth at bedtime., Disp: 30 tablet, Rfl: 3   Review of Systems Review of Systems  Constitutional:  Negative for chills and fever.  Eyes:  Negative for blurred vision and double vision.  Respiratory:  Negative for cough, hemoptysis, sputum production, shortness of breath and wheezing.   Cardiovascular:  Negative for chest pain, palpitations and leg swelling.  Gastrointestinal:  Negative for abdominal pain, constipation, diarrhea, heartburn, nausea and vomiting.  Genitourinary:  Negative for frequency.  Musculoskeletal:  Negative for myalgias.  Skin:  Negative for itching and rash.  Neurological:  Negative for dizziness, weakness and headaches.  Endo/Heme/Allergies:  Negative for polydipsia.       Objective:    Vitals BP 120/88   Pulse 88   Temp 98.4 F (36.9 C)  Resp 16   Ht 6' (1.829 m)   Wt 225 lb 6.4 oz (102.2 kg)   SpO2 96%   BMI 30.57 kg/m    Physical Examination Physical Exam Constitutional:      Appearance: Normal appearance. He is not ill-appearing.  Cardiovascular:     Rate and Rhythm: Normal rate and regular rhythm.     Pulses: Normal pulses.     Heart sounds: No murmur heard.    No friction rub. No gallop.  Pulmonary:     Effort: Pulmonary effort is normal. No respiratory distress.     Breath sounds: No wheezing, rhonchi or rales.  Abdominal:     General: Abdomen is flat. Bowel sounds are normal. There is no distension.     Palpations: Abdomen is soft.     Tenderness: There is no abdominal tenderness.  Musculoskeletal:     Right lower leg: No edema.     Left lower leg: No edema.  Skin:    General: Skin is warm and dry.     Findings: No rash.  Neurological:     General: No focal deficit present.     Mental Status: He is alert and oriented to person, place, and time.  Psychiatric:        Mood and Affect: Mood normal.        Behavior: Behavior normal.        Assessment & Plan:   Essential  hypertension His BP is controlled.  Continue on his current medications.  CAD (coronary artery disease) He denies any angina and his CAD is stable.  We will continue risk factor modification.  Thankfully he stopped smoking and I congratulated him.  He never went for the ECHO in 04/2020 and I am going to refer him back to cardiology.  Continue on his current meds including his ASA.  COPD (chronic obstructive pulmonary disease) (HCC) He is wheezing at night.  I want him to start using his symbicort twice a day.  Obstructive sleep apnea, adult He is not able to tolerate his sleep mask or cannula.  I want him to work on his weight.  Hypercholesterolemia I am going to see him back in 3 months for a yearly exam.  We will do a FLP at that time and his goal LDL <70.      Return in about 3 months (around 10/17/2022) for annual.   Crist Fat, MD

## 2022-07-17 NOTE — Assessment & Plan Note (Signed)
I am going to see him back in 3 months for a yearly exam.  We will do a FLP at that time and his goal LDL <70.

## 2022-07-17 NOTE — Assessment & Plan Note (Signed)
He is wheezing at night.  I want him to start using his symbicort twice a day.

## 2022-07-17 NOTE — Assessment & Plan Note (Signed)
His BP is controlled.  Continue on his current medications.

## 2022-07-26 ENCOUNTER — Encounter: Payer: Self-pay | Admitting: General Practice

## 2022-07-27 ENCOUNTER — Other Ambulatory Visit: Payer: Self-pay | Admitting: Internal Medicine

## 2022-09-16 ENCOUNTER — Other Ambulatory Visit: Payer: Self-pay | Admitting: Internal Medicine

## 2022-10-08 DIAGNOSIS — C61 Malignant neoplasm of prostate: Secondary | ICD-10-CM | POA: Diagnosis not present

## 2022-10-08 DIAGNOSIS — N401 Enlarged prostate with lower urinary tract symptoms: Secondary | ICD-10-CM | POA: Diagnosis not present

## 2022-10-08 DIAGNOSIS — R972 Elevated prostate specific antigen [PSA]: Secondary | ICD-10-CM | POA: Diagnosis not present

## 2022-10-24 IMAGING — MR MR ABDOMEN WO/W CM
12 of 19 series · 27 of 48 positions shown · IV contrast (19 ml multihance)
Comparison: Chest CT 03/19/2021

CLINICAL DATA: Liver mass evaluation

EXAM:
MRI ABDOMEN WITHOUT AND WITH CONTRAST
TECHNIQUE: Multiplanar multisequence MR imaging of the abdomen was performed
both before and after the administration of intravenous contrast.
CONTRAST:  19mL MULTIHANCE GADOBENATE DIMEGLUMINE 529 MG/ML IV SOLN

[Series 3: cor haste · coronal · 5.0mm · 0.68mm/px · 1 of 32 slices shown]
[im 1/32]
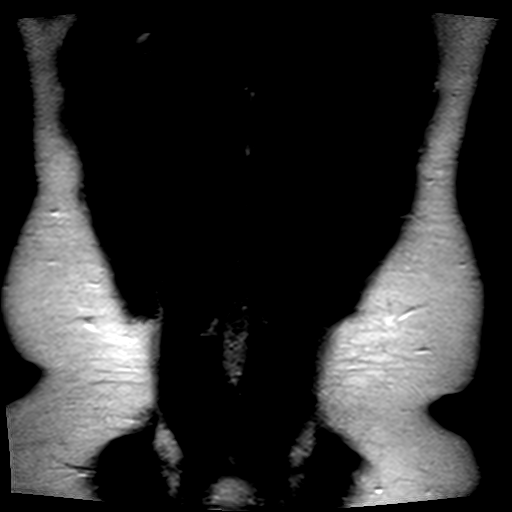

[Series 4: axial haste · axial · 6.0mm · 0.72mm/px · 1 of 33 slices shown]
[im 1/33]
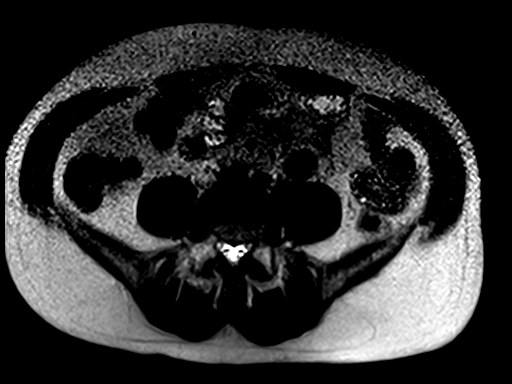

[Series 5: T1 · axial · 6.0mm · 0.72mm/px · z∈[-190,+21]mm · 2 of 66 slices shown]
[im 1/66]
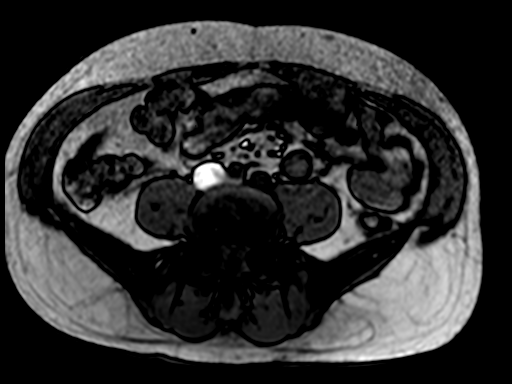
[im 66/66]
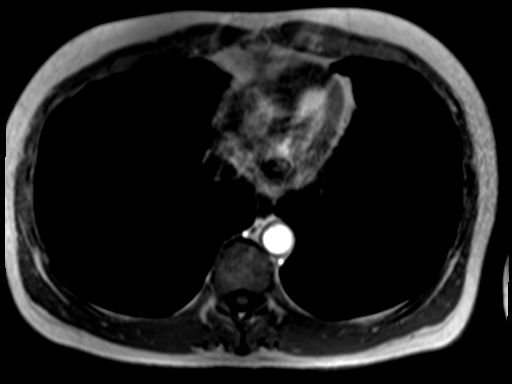

[Series 6: bSSFP · axial · 4.0mm · 0.72mm/px · z∈[-206,+34]mm · 2 of 61 slices shown]
[im 1/61]
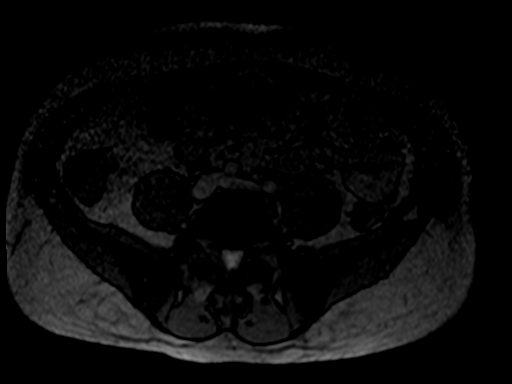
[im 61/61]
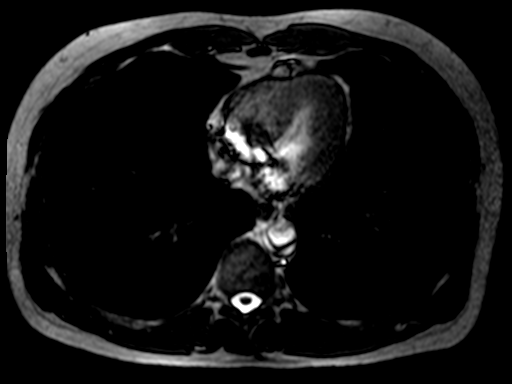

[Series 7: T2 fat-sat · axial · 6.0mm · 1.15mm/px · 1 of 37 slices shown]
[im 1/37]
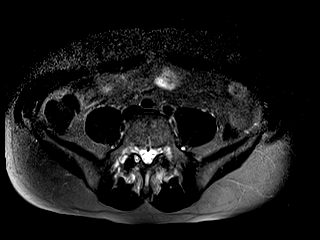

[Series 8: [id number]_[id number]_trig · axial · 6.0mm · 1.93mm/px · z∈[-202,+57]mm · 4 of 111 slices shown]
[im 1/111]
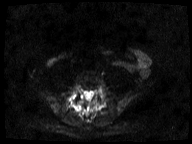
[im 37/111]
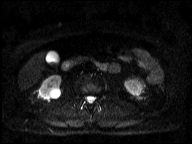
[im 74/111]
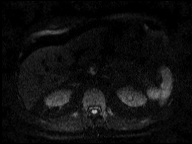
[im 111/111]
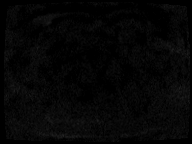

[Series 9: [id number]_trig_adc · axial · 6.0mm · 1.93mm/px · 1 of 36 slices shown]
[im 1/36]
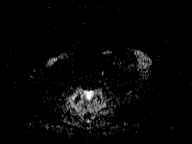

[Series 10: T1 dynamic · axial · non-contrast · 2.5mm · 0.74mm/px · z∈[-200,+17]mm · 3 of 88 slices shown]
[im 1/88]
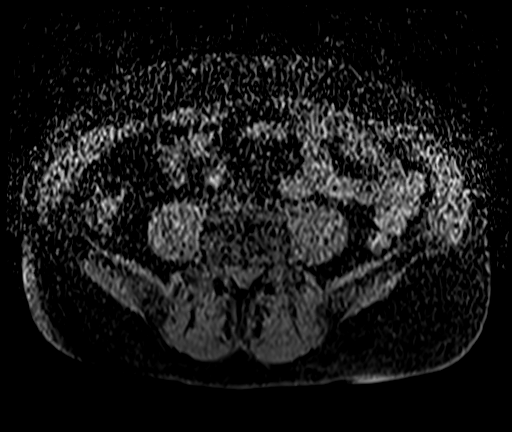
[im 44/88]
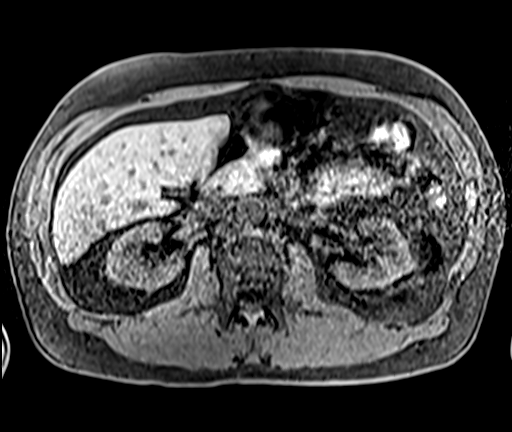
[im 88/88]
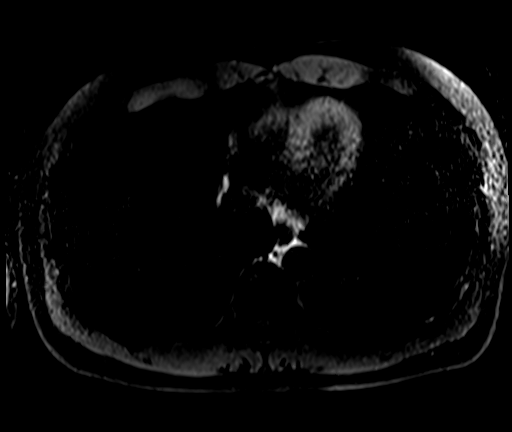

[Series 11: T1 dynamic post-contrast · axial · 2.5mm · 0.74mm/px · z∈[-200,+17]mm · 3 of 88 slices shown (1 of 4)]
[im 1/88]
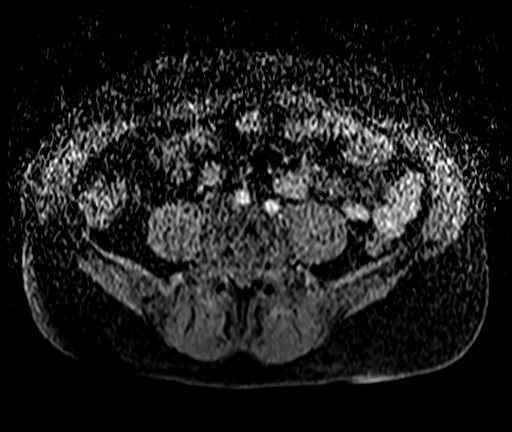
[im 44/88]
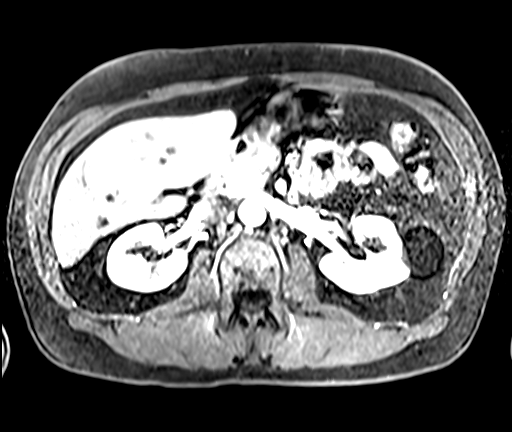
[im 88/88]
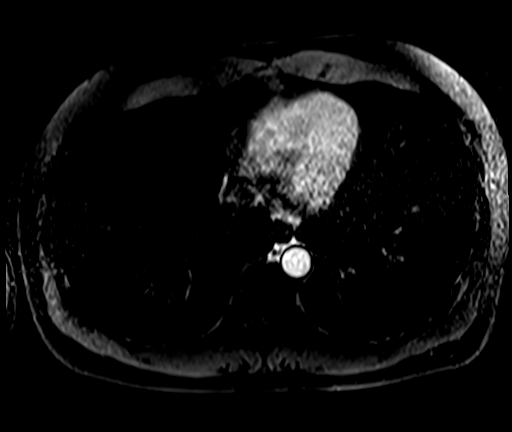

[Series 12: T1 dynamic post-contrast · axial · 2.5mm · 0.74mm/px · z∈[-200,+17]mm · 3 of 88 slices shown (2 of 4)]
[im 1/88]
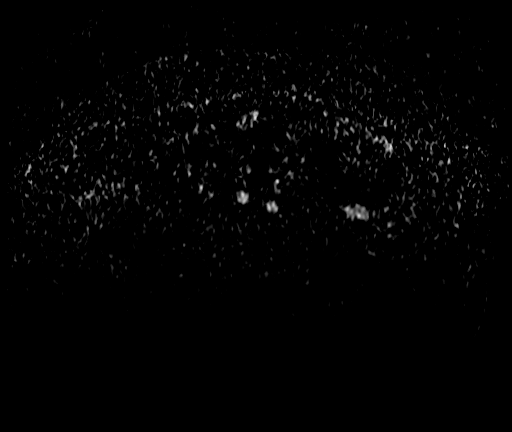
[im 44/88]
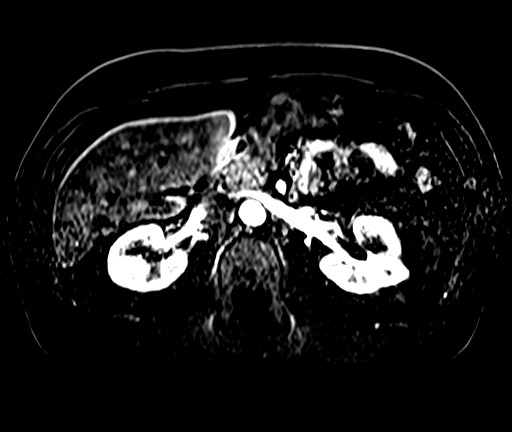
[im 88/88]
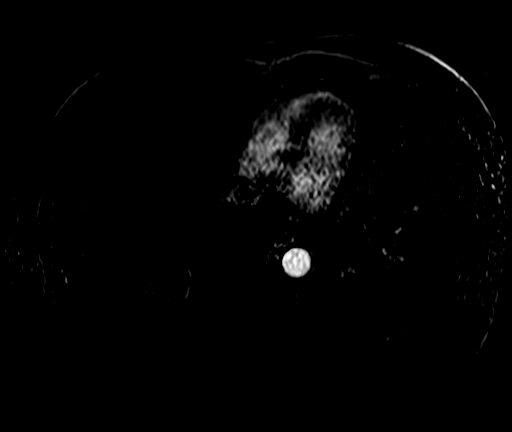

[Series 13: T1 dynamic post-contrast · axial · 2.5mm · 0.74mm/px · z∈[-200,+17]mm · 3 of 88 slices shown (3 of 4)]
[im 1/88]
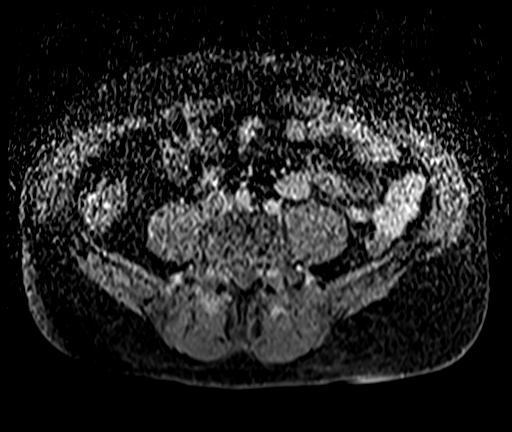
[im 44/88]
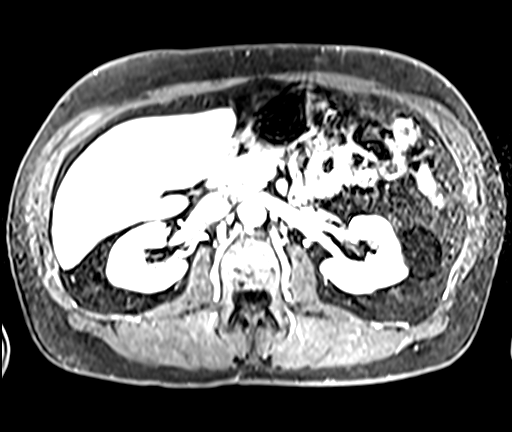
[im 88/88]
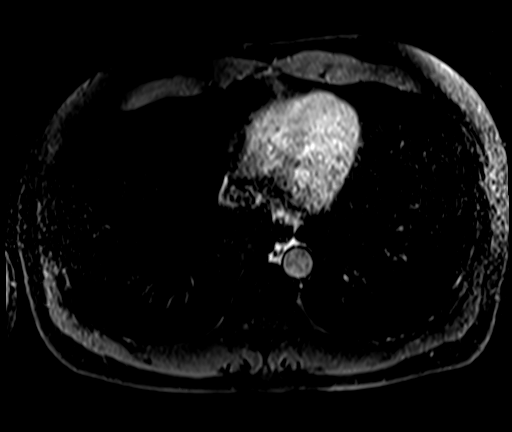

[Series 14: T1 dynamic post-contrast · axial · 2.5mm · 0.74mm/px · z∈[-200,+17]mm · 3 of 88 slices shown (4 of 4)]
[im 1/88]
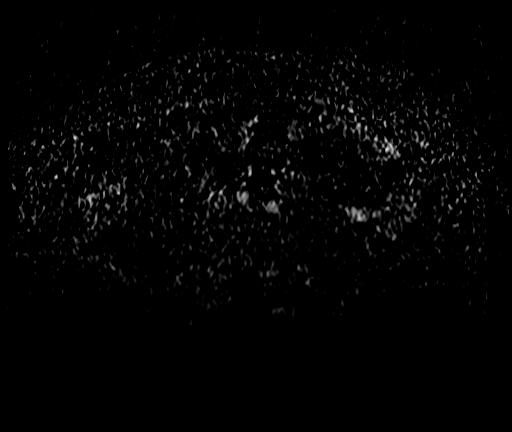
[im 44/88]
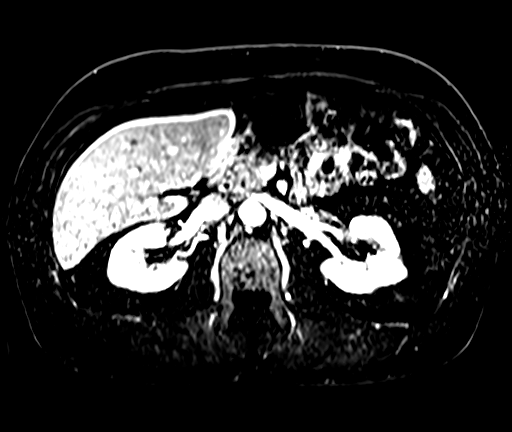
[im 88/88]
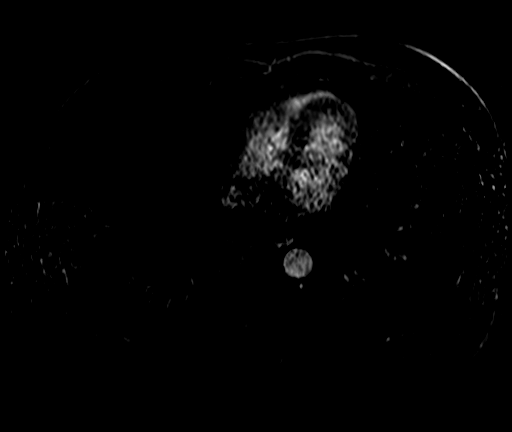

[27 of 48 positions shown; findings below may reference images not displayed]

FINDINGS: Somewhat limited due to motion.

Lower chest: No acute findings.

Hepatobiliary: Liver is normal in size and contour. No suspicious
mass identified. There is a 2.9 x 2.3 cm hyperintense T2 signal mass
in the medial right hepatic lobe segment 7 with enhancement pattern
consistent with hemangioma. There are several small simple hepatic
cysts which measure up to 1.1 cm in size in the left lobe segment 2.
Multiple gallstones visualized. No gallbladder wall thickening or
surrounding edema. No biliary ductal dilatation identified.

Pancreas: No mass, inflammatory changes, or other parenchymal
abnormality identified.

Spleen:  Within normal limits in size and appearance.

Adrenals/Urinary Tract: Adrenal glands appear normal. No enhancing
renal mass or hydronephrosis identified bilaterally. Several renal
cortical cysts bilaterally measuring up to 4 cm on the left and
cm on the right.

Stomach/Bowel: Colonic diverticulosis. No evidence of bowel
obstruction.

Vascular/Lymphatic: No pathologically enlarged lymph nodes
identified. No abdominal aortic aneurysm demonstrated.

Other:  No ascites.

Musculoskeletal: No suspicious bone lesions identified.
IMPRESSION: 1. Right hepatic lobe hemangioma.  Multiple small hepatic cysts.
2. Cholelithiasis.
3. Bilateral renal cysts.
4. Colonic diverticulosis.

## 2022-10-29 ENCOUNTER — Ambulatory Visit: Payer: BC Managed Care – PPO | Admitting: Internal Medicine

## 2022-10-29 ENCOUNTER — Encounter: Payer: Self-pay | Admitting: Internal Medicine

## 2022-10-29 VITALS — BP 148/96 | HR 69 | Temp 98.1°F | Resp 18 | Ht 73.0 in | Wt 220.2 lb

## 2022-10-29 DIAGNOSIS — N401 Enlarged prostate with lower urinary tract symptoms: Secondary | ICD-10-CM | POA: Diagnosis not present

## 2022-10-29 DIAGNOSIS — E782 Mixed hyperlipidemia: Secondary | ICD-10-CM

## 2022-10-29 DIAGNOSIS — J449 Chronic obstructive pulmonary disease, unspecified: Secondary | ICD-10-CM

## 2022-10-29 DIAGNOSIS — I10 Essential (primary) hypertension: Secondary | ICD-10-CM | POA: Diagnosis not present

## 2022-10-29 DIAGNOSIS — Z6829 Body mass index (BMI) 29.0-29.9, adult: Secondary | ICD-10-CM

## 2022-10-29 DIAGNOSIS — Z0000 Encounter for general adult medical examination without abnormal findings: Secondary | ICD-10-CM | POA: Diagnosis not present

## 2022-10-29 DIAGNOSIS — R350 Frequency of micturition: Secondary | ICD-10-CM | POA: Diagnosis not present

## 2022-10-29 DIAGNOSIS — Z87891 Personal history of nicotine dependence: Secondary | ICD-10-CM | POA: Diagnosis not present

## 2022-10-29 DIAGNOSIS — E7800 Pure hypercholesterolemia, unspecified: Secondary | ICD-10-CM

## 2022-10-29 DIAGNOSIS — I2510 Atherosclerotic heart disease of native coronary artery without angina pectoris: Secondary | ICD-10-CM

## 2022-10-29 DIAGNOSIS — F418 Other specified anxiety disorders: Secondary | ICD-10-CM

## 2022-10-29 DIAGNOSIS — G4733 Obstructive sleep apnea (adult) (pediatric): Secondary | ICD-10-CM

## 2022-10-29 DIAGNOSIS — F17210 Nicotine dependence, cigarettes, uncomplicated: Secondary | ICD-10-CM

## 2022-10-29 MED ORDER — BREZTRI AEROSPHERE 160-9-4.8 MCG/ACT IN AERO: 2.0000 | INHALATION_SPRAY | Freq: Two times a day (BID) | RESPIRATORY_TRACT | Status: DC

## 2022-10-29 MED ORDER — TAMSULOSIN HCL 0.4 MG PO CAPS: 0.4000 mg | ORAL_CAPSULE | Freq: Every day | ORAL | 1 refills | Status: AC

## 2022-10-29 MED ORDER — BUPROPION HCL ER (SR) 150 MG PO TB12: ORAL_TABLET | ORAL | 2 refills | Status: AC

## 2022-10-29 NOTE — Assessment & Plan Note (Signed)
He cannot tolerate a sleep mask.  I want him to work on his weight and smoking.

## 2022-10-29 NOTE — Assessment & Plan Note (Signed)
He is on proscar and I am going to add flomax to his regimen.

## 2022-10-29 NOTE — Progress Notes (Signed)
Office Visit  Subjective   Patient ID: Ryan Shaffer   DOB: 05-Apr-1960   Age: 62 y.o.   MRN: [id number]   Chief Complaint Chief Complaint  Patient presents with   Annual Exam     History of Present Illness [name] is a 62 yo male who comes in today for his annual physical examination.  He is currently due for the following:  screening labs.   His PMHx consists of HTN, COPD, CAD, HLD, OSA and tobacco abuse.  His last eye exam was done over 1 year ago where he uses glasses to read and otherwise has no eye diseases.  His last colonoscopy was done in 2021 done in [place].  He was told he did not need to go back for another 10 years.  There is no family history of colorectal cancer or prostate cancer.  He is having frequency. He has seen a urologist for this were he is on proscar.  He did have a flu vaccine a few weeks ago.  He received his first shingrix vaccine 2 weeks ago.  He has had one COVID-19 vaccine.  There is a family history of heart attack and stroke and his brother has had a stroke in the past.  The patient is on an ASA 81mg  daily.  The patient is a 62 year old Caucasian/White male who returns for followup of his COPD.  He states that he was diagnosed 7-8 years by [facility].  He has a history of tobacco abuse where he started smoking at age 89 and smoked about 1 ppd.  He did start cutting back where 1 year ago he was smoking maybe 1/2 ppd at this time.  He quit smoking in May and was on a nicotine patch but started back smoking about 3 weeks ago. It was reported to me that he was on a symbicort inhaler which he is supposed to take twice a day but on further questioning, he is not on symbicort but is on breztri 160 2 puffs BID.  He is also on albuterol which he may use 2-3 times per week.  He has no baseline symptoms of COPD.  He states he can climb a flight of stairs and may get some SOB and he does have wheezing at night.  The patient's condition is controlled by  medications as summarized in the medication list on the face sheet. He has the following modifiable risk factors: history of smoking. Specifically denied complaints: worsening exertional dyspnea, increased wheezing, change in color of sputum, pleuritic chest pain, hemoptysis, and fever.  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   The patient also reports history of sleep apnea.  He was diagnosed with OSA in [place] about 5 years ago.  The following additional complaints are also reported: he does not tolerate either full mask or nasal mask and has not been using his machine. He has seen the DME company and these were his only options it seems. There is no reported history of frequent daytime napping and a recent automobile accident. The patient's past medical history is notable for hypertension and COPD and MI.Marland Kitchen His social history is is notable for smoking. A sleep study was performed on 05/09/2020 and demonstrated obstructive apnea. The patient's lowest level of oxygen desaturation was 88 %. His AHI/REI is 42.9 and ODI is 26.0.  The respiratory disturbance index (RDI) was 47.3. Prior treatments include: nasal CPAP.    The patient is a 62 year old Caucasian/White male who presents for a follow-up evaluation of hypertension.  Since his last visit, he has not had any problems.  The patient has not been checking his blood pressure at home. The patient's current medications include: carvedilol 6.25 mg BID and lisinopril 2.5 mg daily. The patient has been tolerating his medications well. The patient denies any visual changes, lightheadness, chest pain, shortness of breath, and weakness/numbness. He reports there have been no other symptoms noted.    This patient also has a history of CAD where he had a MI in 2015 and he underwent cardiac catherization with stenting.  He was followed by  cardiology with his last visit in 04/2020.  He saw Dr. Josiah Lobo in 2022 where they noted he had a CABG but the patient has never had a CABG.  He was told this was a mild heart attack.  He tells me that he did not go into heart failure.  Cardiology was planning to do an ECHO when they saw him in 04/2020 but I do not see these results in [facility] or at [facility].  He presents today for a status visit. See PMH for summary of cardiac history and status of revascularization. Her CAD is controlled with therapy as summarized in the medication list and previous notes. The patient has comorbid conditions including HTN, HCL, OSA and history of tobacco abuse.  He has the following baseline symptoms: exertional dyspnea, chest pain, peripheral edema and palpitations. He has the following modifiable risk factor(s): HTN, DM, hyperlipidemia, and sedentary lifestyle. Specifically denied complaint(s): chest pain, palpitations, orthopnea, edema, exertional dyspnea, and syncope. He is currently on coreg, lisinopril, lipitor and ASA 81mg  daily.    The patient also has a history of high cholesterol when he was admitted for his MI in 57. Overall, he states he is doing well and is without any complaints or problems at this time. He specifically denies abdominal pain, nausea, vomiting, diarrhea, myalgias, and fatigue.  He remains on dietary management as well as lipitor 80mg  qhs. She is fasting in anticipation for labs today.   The patient also comes in today complaining of increased anxiety and possible depression.  He states this is all related to his job that he started over 3 months ago.  The patient does complain of fatigue and out of control feelings.  He denies difficulty concentrating, difficulty performing routine daily activities, extreme feelings of guilt, feelings of isolation, feelings of worthlessness, helpless feeling, hypersomnia, insomnia, loss of appetite, social withdrawal, loss of interest in pleasurable  activities, out of control feelings, weight loss, suicidal ideation, homicidal ideation, or panic attacks. This patient feels that he is able to care for himself. He has no predisposing factors for depression or anxiety. He currently lives with his family. He has no significant prior history of mental health disorders.   .     Past Medical History Past Medical History:  Diagnosis Date   Benign essential hypertension    BMI 27.0-27.9,adult    CAD (coronary artery disease)    COPD (chronic obstructive pulmonary disease) (HCC)    Coronary artery disease with history of coronary revascularization    Genital warts    Mixed hyperlipidemia    Nicotine dependence    Obstructive sleep apnea, adult      Allergies No Known Allergies   Medications  Current Outpatient Medications:  albuterol (VENTOLIN HFA) 108 (90 Base) MCG/ACT inhaler, Inhale 2 puffs into the lungs every 4 (four) hours as needed for wheezing or shortness of breath., Disp: , Rfl:    aspirin EC 81 MG tablet, Take 1 tablet (81 mg total) by mouth daily. Swallow whole., Disp: 90 tablet, Rfl: 3   atorvastatin (LIPITOR) 80 MG tablet, Take 80 mg by mouth daily., Disp: , Rfl:    carvedilol (COREG) 6.25 MG tablet, TAKE 1 TABLET BY MOUTH TWICE A DAY WITH FOOD, Disp: 180 tablet, Rfl: 1   finasteride (PROSCAR) 5 MG tablet, Take 5 mg by mouth daily., Disp: , Rfl:    imiquimod (ALDARA) 5 % cream, Apply 1 application topically 3 (three) times a week., Disp: , Rfl:    lisinopril (ZESTRIL) 2.5 MG tablet, TAKE 1 TABLET BY MOUTH EVERY DAY, Disp: 90 tablet, Rfl: 1   montelukast (SINGULAIR) 10 MG tablet, TAKE 1 TABLET BY MOUTH EVERY DAY, Disp: 90 tablet, Rfl: 1   nitroGLYCERIN (NITROSTAT) 0.4 MG SL tablet, Place 1 tablet (0.4 mg total) under the tongue every 5 (five) minutes as needed for chest pain., Disp: 30 tablet, Rfl: 2   pantoprazole (PROTONIX) 40 MG tablet, Take 1 tablet (40 mg total) by mouth daily., Disp: 90 tablet, Rfl: 3   Review of  Systems Review of Systems  Constitutional:  Negative for chills, fever, malaise/fatigue and weight loss.  Eyes:  Negative for blurred vision and double vision.  Respiratory:  Positive for cough and shortness of breath. Negative for hemoptysis and wheezing.   Cardiovascular:  Negative for chest pain, palpitations and leg swelling.  Gastrointestinal:  Negative for abdominal pain, blood in stool, constipation, diarrhea, heartburn, melena, nausea and vomiting.  Genitourinary:  Positive for frequency. Negative for hematuria.  Musculoskeletal:  Negative for myalgias.  Skin:  Negative for itching and rash.  Neurological:  Negative for dizziness, weakness and headaches.  Endo/Heme/Allergies:  Negative for polydipsia.  Psychiatric/Behavioral:  Negative for depression, substance abuse and suicidal ideas. The patient is nervous/anxious and has insomnia.        Objective:    Vitals BP (!) 148/96   Pulse 69   Temp 98.1 F (36.7 C)   Resp 18   Ht 6\' 1"  (1.854 m)   Wt 220 lb 3.2 oz (99.9 kg)   SpO2 96%   BMI 29.05 kg/m    Physical Examination Physical Exam Constitutional:      Appearance: Normal appearance. He is not ill-appearing.  HENT:     Head: Normocephalic and atraumatic.     Right Ear: Tympanic membrane, ear canal and external ear normal.     Left Ear: Tympanic membrane, ear canal and external ear normal.     Nose: Rhinorrhea present. No congestion.     Mouth/Throat:     Mouth: Mucous membranes are moist.     Pharynx: Oropharynx is clear. No oropharyngeal exudate or posterior oropharyngeal erythema.  Eyes:     General: No scleral icterus.    Conjunctiva/sclera: Conjunctivae normal.     Pupils: Pupils are equal, round, and reactive to light.  Neck:     Vascular: No carotid bruit.  Cardiovascular:     Rate and Rhythm: Normal rate and regular rhythm.     Pulses: Normal pulses.     Heart sounds: No murmur heard.    No friction rub. No gallop.  Pulmonary:     Effort:  Pulmonary effort is normal. No respiratory distress.     Breath sounds: No wheezing,  rhonchi or rales.  Abdominal:     General: Abdomen is flat. Bowel sounds are normal. There is no distension.     Palpations: Abdomen is soft.     Tenderness: There is no abdominal tenderness.  Musculoskeletal:     Cervical back: Neck supple. No tenderness.     Right lower leg: No edema.     Left lower leg: No edema.  Lymphadenopathy:     Cervical: No cervical adenopathy.  Skin:    General: Skin is warm and dry.     Findings: No rash.  Neurological:     General: No focal deficit present.     Mental Status: He is alert and oriented to person, place, and time.  Psychiatric:        Mood and Affect: Mood normal.        Behavior: Behavior normal.        Assessment & Plan:   Essential hypertension His BP is elevated.  I will see what his BP is doing on his next visit and adjust his meds at that time.  CAD (coronary artery disease) He has CAD that seems stable and he denies any angina.  We will continue with risk factor modification with prevention with a statin and ASA and he is on a BB.    COPD (chronic obstructive pulmonary disease) (HCC) He has worsening COPD.  I had a discussion about stopping his cigarette use. I want him to continue on breztri and we will refer him to Dr. Heath Gold in pulmonary medicine for evaluation.  OSA (obstructive sleep apnea) He cannot tolerate a sleep mask.  I want him to work on his weight and smoking.  Annual physical exam Health maintenance discussed.  He will go back in a few months for his second shingrix.  We will obtain some yearly labs.  Benign prostatic hyperplasia with urinary frequency He is on proscar and I am going to add flomax to his regimen.  BMI 29.0-29.9,adult I want him to eat healthy, exercise and lose weight.  Cigarette smoker Smoking cessation discussed.  I am going to start him on zyban (bupropion) for smoking cessation.  Mixed  hyperlipidemia He is on lipitor for his history of CAD.  His goal LDL <70.  Nicotine dependence Plan as above.  Situational anxiety He has some situtational anxiety with his work.  I am going to start him on zyban and we will see if this helps.  I discussed counselling and he is not interested.    Return in about 3 months (around 01/29/2023).   Crist Fat, MD

## 2022-10-29 NOTE — Assessment & Plan Note (Signed)
Plan as above.  

## 2022-10-29 NOTE — Assessment & Plan Note (Signed)
He is on lipitor for his history of CAD.  His goal LDL <70.

## 2022-10-29 NOTE — Assessment & Plan Note (Signed)
Smoking cessation discussed.  I am going to start him on zyban (bupropion) for smoking cessation.

## 2022-10-29 NOTE — Assessment & Plan Note (Signed)
I want him to eat healthy, exercise and lose weight.

## 2022-10-29 NOTE — Assessment & Plan Note (Signed)
His BP is elevated.  I will see what his BP is doing on his next visit and adjust his meds at that time.

## 2022-10-29 NOTE — Assessment & Plan Note (Signed)
Health maintenance discussed.  He will go back in a few months for his second shingrix.  We will obtain some yearly labs.

## 2022-10-29 NOTE — Assessment & Plan Note (Signed)
He has CAD that seems stable and he denies any angina.  We will continue with risk factor modification with prevention with a statin and ASA and he is on a BB.

## 2022-10-29 NOTE — Assessment & Plan Note (Signed)
He has some situtational anxiety with his work.  I am going to start him on zyban and we will see if this helps.  I discussed counselling and he is not interested.

## 2022-10-29 NOTE — Assessment & Plan Note (Signed)
He has worsening COPD.  I had a discussion about stopping his cigarette use. I want him to continue on breztri and we will refer him to Dr. Heath Gold in pulmonary medicine for evaluation.

## 2022-10-30 LAB — CBC WITH DIFFERENTIAL/PLATELET
Basophils Absolute: 0 10*3/uL (ref 0.0–0.2)
Basos: 0 %
EOS (ABSOLUTE): 0.3 10*3/uL (ref 0.0–0.4)
Eos: 3 %
Hematocrit: 46.4 % (ref 37.5–51.0)
Hemoglobin: 15.3 g/dL (ref 13.0–17.7)
Immature Grans (Abs): 0 10*3/uL (ref 0.0–0.1)
Immature Granulocytes: 0 %
Lymphocytes Absolute: 1.9 10*3/uL (ref 0.7–3.1)
Lymphs: 24 %
MCH: 30.2 pg (ref 26.6–33.0)
MCHC: 33 g/dL (ref 31.5–35.7)
MCV: 92 fL (ref 79–97)
Monocytes Absolute: 0.5 10*3/uL (ref 0.1–0.9)
Monocytes: 6 %
Neutrophils Absolute: 5.1 10*3/uL (ref 1.4–7.0)
Neutrophils: 67 %
Platelets: 210 10*3/uL (ref 150–450)
RBC: 5.06 x10E6/uL (ref 4.14–5.80)
RDW: 12.8 % (ref 11.6–15.4)
WBC: 7.8 10*3/uL (ref 3.4–10.8)

## 2022-10-30 LAB — PSA: Prostate Specific Ag, Serum: 3 ng/mL (ref 0.0–4.0)

## 2022-10-30 LAB — LIPID PANEL
Chol/HDL Ratio: 2.9 {ratio} (ref 0.0–5.0)
Cholesterol, Total: 169 mg/dL (ref 100–199)
HDL: 58 mg/dL (ref >39)
LDL Chol Calc (NIH): 95 mg/dL (ref 0–99)
Triglycerides: 84 mg/dL (ref 0–149)
VLDL Cholesterol Cal: 16 mg/dL (ref 5–40)

## 2022-10-30 LAB — CMP14 + ANION GAP
ALT: 22 [IU]/L (ref 0–44)
AST: 22 [IU]/L (ref 0–40)
Albumin: 4.5 g/dL (ref 3.9–4.9)
Alkaline Phosphatase: 92 [IU]/L (ref 44–121)
Anion Gap: 11 mmol/L (ref 10.0–18.0)
BUN/Creatinine Ratio: 19 (ref 10–24)
BUN: 19 mg/dL (ref 8–27)
Bilirubin Total: 0.5 mg/dL (ref 0.0–1.2)
CO2: 25 mmol/L (ref 20–29)
Calcium: 9.5 mg/dL (ref 8.6–10.2)
Chloride: 103 mmol/L (ref 96–106)
Creatinine, Ser: 0.99 mg/dL (ref 0.76–1.27)
Globulin, Total: 2.4 g/dL (ref 1.5–4.5)
Glucose: 96 mg/dL (ref 70–99)
Potassium: 5 mmol/L (ref 3.5–5.2)
Sodium: 139 mmol/L (ref 134–144)
Total Protein: 6.9 g/dL (ref 6.0–8.5)
eGFR: 87 mL/min/{1.73_m2} (ref >59)

## 2022-10-30 LAB — HEMOGLOBIN A1C
Est. average glucose Bld gHb Est-mCnc: 126 mg/dL
Hgb A1c MFr Bld: 6 % — ABNORMAL HIGH (ref 4.8–5.6)

## 2022-10-30 LAB — TSH: TSH: 0.666 u[IU]/mL (ref 0.450–4.500)

## 2022-10-31 ENCOUNTER — Other Ambulatory Visit: Payer: Self-pay

## 2022-10-31 DIAGNOSIS — E7800 Pure hypercholesterolemia, unspecified: Secondary | ICD-10-CM

## 2022-10-31 MED ORDER — ROSUVASTATIN CALCIUM 20 MG PO TABS: 20.0000 mg | ORAL_TABLET | Freq: Every day | ORAL | 11 refills | Status: DC

## 2022-10-31 NOTE — Progress Notes (Signed)
Hey he has had a MI in the past and his cholesterol is not controlled. Switched his lipitor to crestor 20mg  at bedtime. He has also developed prediabetes. I want him to eat healthy, cut sugars and exercise. His other labs look good.  Left detailed message on recorder, also to call the office for results and change in Meds

## 2022-12-05 DIAGNOSIS — F17210 Nicotine dependence, cigarettes, uncomplicated: Secondary | ICD-10-CM | POA: Diagnosis not present

## 2022-12-05 DIAGNOSIS — J449 Chronic obstructive pulmonary disease, unspecified: Secondary | ICD-10-CM | POA: Diagnosis not present

## 2022-12-05 DIAGNOSIS — R400 Somnolence: Secondary | ICD-10-CM | POA: Diagnosis not present

## 2022-12-05 DIAGNOSIS — G4733 Obstructive sleep apnea (adult) (pediatric): Secondary | ICD-10-CM | POA: Diagnosis not present

## 2022-12-05 DIAGNOSIS — J301 Allergic rhinitis due to pollen: Secondary | ICD-10-CM | POA: Diagnosis not present

## 2023-01-02 ENCOUNTER — Ambulatory Visit: Payer: BC Managed Care – PPO | Admitting: Internal Medicine

## 2023-01-02 ENCOUNTER — Encounter: Payer: Self-pay | Admitting: Internal Medicine

## 2023-01-02 VITALS — BP 136/82 | Temp 98.6°F | Resp 17 | Ht 73.0 in | Wt 215.8 lb

## 2023-01-02 DIAGNOSIS — M21621 Bunionette of right foot: Secondary | ICD-10-CM | POA: Diagnosis not present

## 2023-01-02 DIAGNOSIS — I2510 Atherosclerotic heart disease of native coronary artery without angina pectoris: Secondary | ICD-10-CM

## 2023-01-02 NOTE — Assessment & Plan Note (Signed)
I am going to refer him to podiatry for this bunion.

## 2023-01-02 NOTE — Addendum Note (Signed)
Addended by: Crist Fat on: 01/02/2023 10:44 AM   Modules accepted: Orders

## 2023-01-02 NOTE — Progress Notes (Addendum)
Office Visit  Subjective   Patient ID: Anfrenee Mcclarin   DOB: 06-Feb-1960   Age: 62 y.o.   MRN: [id number]   Chief Complaint Chief Complaint  Patient presents with   Acute Visit    Needs Referral     History of Present Illness Mr. Blinn is a 62 yo male who comes in today asking for a work note.  He states that his work causes increased stress and now it is too physically demanding.  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  He wants to go from working 5 days a week to 4 days a week.  He states this job is wearing him down both mentally and physically.  His PMHx includes HTN, CAD/MI, hyperlipidemia and history of anxiety/depression.  There has been no change to his depression/anxiety since his last visit.     He also has caluses over the head of his right 5th metatarsal which has been going on for a year.  He has not tried doing anything OTC with this area but he complains of pain when he puts weight on his feet and it feels like he is walking on a stone in this area.     Past Medical History Past Medical History:  Diagnosis Date   Benign essential hypertension    BMI 27.0-27.9,adult    CAD (coronary artery disease)    COPD (chronic obstructive pulmonary disease) (HCC)    Coronary artery disease with history of coronary revascularization    Genital warts    Mixed hyperlipidemia    Nicotine dependence    Obstructive sleep apnea, adult      Allergies No Known Allergies   Medications  Current Outpatient Medications:    rosuvastatin (CRESTOR) 20 MG tablet, Take 1 tablet (20 mg total) by mouth daily., Disp: 30 tablet, Rfl: 11   albuterol (VENTOLIN HFA) 108 (90 Base) MCG/ACT inhaler, Inhale 2 puffs into the lungs every 4 (four) hours as needed for wheezing or shortness of breath., Disp: , Rfl:    aspirin EC 81 MG tablet, Take 1 tablet (81 mg total) by mouth daily. Swallow whole., Disp: 90 tablet, Rfl:  3   Budeson-Glycopyrrol-Formoterol (BREZTRI AEROSPHERE) 160-9-4.8 MCG/ACT AERO, Inhale 2 puffs into the lungs in the morning and at bedtime., Disp: , Rfl:    buPROPion (WELLBUTRIN SR) 150 MG 12 hr tablet, Take one tab po daily x 3 days, then one tab po BID, Disp: 60 tablet, Rfl: 2   carvedilol (COREG) 6.25 MG tablet, TAKE 1 TABLET BY MOUTH TWICE A DAY WITH FOOD, Disp: 180 tablet, Rfl: 1   finasteride (PROSCAR) 5 MG tablet, Take 5 mg by mouth daily., Disp: , Rfl:    imiquimod (ALDARA) 5 % cream, Apply 1 application topically 3 (three) times a week., Disp: , Rfl:    lisinopril (ZESTRIL) 2.5 MG tablet, TAKE 1 TABLET BY MOUTH EVERY DAY, Disp: 90 tablet, Rfl: 1   montelukast (SINGULAIR) 10 MG tablet, TAKE 1 TABLET BY MOUTH EVERY DAY, Disp: 90 tablet, Rfl: 1   nitroGLYCERIN (NITROSTAT) 0.4 MG SL tablet, Place 1 tablet (0.4 mg total) under the tongue every 5 (five) minutes as needed for chest pain., Disp: 30 tablet, Rfl: 2   pantoprazole (PROTONIX) 40 MG tablet, Take 1 tablet (40 mg total) by mouth daily., Disp: 90 tablet, Rfl: 3   tamsulosin (FLOMAX) 0.4 MG CAPS capsule, Take 1 capsule (0.4 mg total)  by mouth daily., Disp: 90 capsule, Rfl: 1   Review of Systems Review of Systems  Constitutional:  Positive for malaise/fatigue. Negative for chills and fever.  Eyes:  Negative for blurred vision and double vision.  Respiratory:  Negative for cough and shortness of breath.   Cardiovascular:  Negative for chest pain, palpitations and leg swelling.  Neurological:  Negative for dizziness, weakness and headaches.       Objective:    Vitals BP 136/82   Temp 98.6 F (37 C)   Resp 17   Ht 6\' 1"  (1.854 m)   Wt 215 lb 12.8 oz (97.9 kg)   SpO2 97%   BMI 28.47 kg/m    Physical Examination Physical Exam Constitutional:      Appearance: Normal appearance. He is not ill-appearing.  Cardiovascular:     Rate and Rhythm: Normal rate and regular rhythm.     Pulses: Normal pulses.     Heart sounds: No  murmur heard.    No friction rub. No gallop.  Pulmonary:     Effort: Pulmonary effort is normal. No respiratory distress.     Breath sounds: No wheezing, rhonchi or rales.  Abdominal:     General: Abdomen is flat. Bowel sounds are normal. There is no distension.     Palpations: Abdomen is soft.     Tenderness: There is no abdominal tenderness.  Musculoskeletal:     Right lower leg: No edema.     Left lower leg: No edema.  Skin:    General: Skin is warm and dry.     Findings: No rash.  Neurological:     Mental Status: He is alert.        Assessment & Plan:   CAD (coronary artery disease) I think its very reasonable with his multiple medical problems to go down to 4 days a week to work.  We will write him a note.  He is not having any angina at this time and we will continue his BB, statin and ASA.  Tailor's bunion of right foot I am going to refer him to podiatry for this bunion.    No follow-ups on file.   Crist Fat, MD

## 2023-01-02 NOTE — Assessment & Plan Note (Signed)
I think its very reasonable with his multiple medical problems to go down to 4 days a week to work.  We will write him a note.  He is not having any angina at this time and we will continue his BB, statin and ASA.

## 2023-01-17 ENCOUNTER — Other Ambulatory Visit: Payer: Self-pay | Admitting: Internal Medicine

## 2023-01-20 ENCOUNTER — Other Ambulatory Visit: Payer: Self-pay

## 2023-01-20 MED ORDER — BREZTRI AEROSPHERE 160-9-4.8 MCG/ACT IN AERO: 2.0000 | INHALATION_SPRAY | Freq: Two times a day (BID) | RESPIRATORY_TRACT | 2 refills | Status: AC

## 2023-01-20 MED ORDER — ALBUTEROL SULFATE HFA 108 (90 BASE) MCG/ACT IN AERS: 2.0000 | INHALATION_SPRAY | RESPIRATORY_TRACT | 2 refills | Status: DC | PRN

## 2023-01-23 ENCOUNTER — Ambulatory Visit (INDEPENDENT_AMBULATORY_CARE_PROVIDER_SITE_OTHER): Payer: BC Managed Care – PPO | Admitting: Podiatry

## 2023-01-23 DIAGNOSIS — D2371 Other benign neoplasm of skin of right lower limb, including hip: Secondary | ICD-10-CM

## 2023-01-23 DIAGNOSIS — M79671 Pain in right foot: Secondary | ICD-10-CM

## 2023-01-23 NOTE — Progress Notes (Signed)
  Chief Complaint  Patient presents with   Foot Pain    RT foot has a hardened spot on the ball of his foot. Feels hard, like he is walking on a pebble. Has tried to get rid of it with otc meds, cutting it out himself. Hurts more when he is on his feet for extended periods.    HPI: 63 y.o. male presents today with concern of a painful hard spot of skin on the plantar aspect of the right foot.  Patient states that this has been present for around 1 year.  Denies stepping on a foreign object.  Past Medical History:  Diagnosis Date   Benign essential hypertension    BMI 27.0-27.9,adult    CAD (coronary artery disease)    COPD (chronic obstructive pulmonary disease) (HCC)    Coronary artery disease with history of coronary revascularization    Genital warts    Mixed hyperlipidemia    Nicotine dependence    Obstructive sleep apnea, adult     Past Surgical History:  Procedure Laterality Date   NO PAST SURGERIES     No Known Allergies  Physical Exam: Palpable pedal pulses of the right foot.  There is a focal hyperkeratotic lesion with pain on palpation right submet 5.  No ulceration or surrounding erythema is noted.  No evidence of foreign body is seen.  Assessment/Plan of Care: 1. Benign neoplasm of skin of right foot   2. Pain in right foot    Discussed clinical findings with patient today.  The skin neoplasm was shaved with a sterile #313 blade.  Following this, Cantharone solution was applied to the area followed by an occlusive Band-Aid.  He will remove this in 5 to 6 hours.  Will reevaluate in approximately 1 month.  An offloading felt pad was placed in his shoe to offload the area and decrease pain.   Awanda CHARM Imperial, DPM, FACFAS [facility]     [address] [place], [place] [address]                Office [phone]  Fax [phone]

## 2023-01-30 ENCOUNTER — Ambulatory Visit: Payer: BC Managed Care – PPO | Admitting: Internal Medicine

## 2023-01-30 ENCOUNTER — Ambulatory Visit (INDEPENDENT_AMBULATORY_CARE_PROVIDER_SITE_OTHER): Payer: BC Managed Care – PPO | Admitting: Podiatry

## 2023-01-30 DIAGNOSIS — M79671 Pain in right foot: Secondary | ICD-10-CM

## 2023-01-30 DIAGNOSIS — D2371 Other benign neoplasm of skin of right lower limb, including hip: Secondary | ICD-10-CM

## 2023-01-30 NOTE — Progress Notes (Signed)
Chief Complaint  Patient presents with   Foot Pain    Pt state he had a corn shaved off at last visit, 01/23/23, was fine when he left but the next day it was very painful and has hurt ever since. Rt lat side near 5th toe. There is a dark spot in the center of it. Not diabetic, ASA 81 and another anti coag.    HPI: 63 y.o. male presents today after calling to make this appointment, noting worsening pain of his right plantar skin lesion after his last visit.  Cantharone solution was applied to the lesion after was shaved on the last visit.  Patient denies any blistering but it does appear upon visualization of the foot today that there was a small blister that had formed but has already drained.  Patient stated "I think the cord just needs shaved out so will not come back".  Informed the patient that this old wives tail does not always hold true and if there is increased pressure from the underlying bone the corn will recur.  He was given an offloading pad that was applied to his shoe insole at his last visit.  He noted he took this off and put on another pair of shoes at home.  Therefore, he does not have any offloading pad underneath today's shoe insoles.  Past Medical History:  Diagnosis Date   Benign essential hypertension    BMI 27.0-27.9,adult    CAD (coronary artery disease)    COPD (chronic obstructive pulmonary disease) (HCC)    Coronary artery disease with history of coronary revascularization    Genital warts    Mixed hyperlipidemia    Nicotine dependence    Obstructive sleep apnea, adult     Past Surgical History:  Procedure Laterality Date   NO PAST SURGERIES     No Known Allergies   Physical Exam: Palpable pedal pulses are noted.  No surrounding erythema or edema or open lesions to the right submet 5 area.  There is minimal hyperkeratosis in this area.  There is evidence of previous small blister formation which has already drained.  No open lesions noted.  Pain on  palpation to the right second metatarsal.  Assessment/Plan of Care: 1. Pain in right foot   2. Benign neoplasm of skin of right foot    Discussed clinical findings with patient today.  Informed the patient that the increased pain could be from the [facility] application on last visit.  Had informed the patient that he may expect a blister within 24 to 48 hours of the application, as this usually can result in some increased pain for short period of time.  However, today will avoid any Cantharone application.  The minimal hyperkeratosis was shaved with a sterile #313 blade and a new offloading pad was applied underneath his shoe insole.  He would benefit from custom orthotics with 1/5 met cut out/accommodation made to offload this area.  If this does not continue to provide improvement he may need to have surgical intervention to shave the prominent bone/condyle.  Follow-up as needed   Clerance Lav, DPM, FACFAS [facility]     [address], [place] [address]  Office [phone]  Fax [phone]

## 2023-02-06 ENCOUNTER — Ambulatory Visit: Payer: BC Managed Care – PPO | Admitting: Internal Medicine

## 2023-02-06 ENCOUNTER — Other Ambulatory Visit: Payer: Self-pay

## 2023-02-27 ENCOUNTER — Ambulatory Visit: Payer: BC Managed Care – PPO | Admitting: Podiatry

## 2023-04-04 ENCOUNTER — Other Ambulatory Visit: Payer: Self-pay | Admitting: Internal Medicine

## 2023-04-10 DIAGNOSIS — N401 Enlarged prostate with lower urinary tract symptoms: Secondary | ICD-10-CM | POA: Diagnosis not present

## 2023-04-10 DIAGNOSIS — C61 Malignant neoplasm of prostate: Secondary | ICD-10-CM | POA: Diagnosis not present

## 2023-04-10 DIAGNOSIS — R972 Elevated prostate specific antigen [PSA]: Secondary | ICD-10-CM | POA: Diagnosis not present

## 2023-05-16 DIAGNOSIS — G4733 Obstructive sleep apnea (adult) (pediatric): Secondary | ICD-10-CM | POA: Diagnosis not present

## 2023-05-20 DIAGNOSIS — J449 Chronic obstructive pulmonary disease, unspecified: Secondary | ICD-10-CM | POA: Diagnosis not present

## 2023-05-20 DIAGNOSIS — F17210 Nicotine dependence, cigarettes, uncomplicated: Secondary | ICD-10-CM | POA: Diagnosis not present

## 2023-05-20 DIAGNOSIS — J301 Allergic rhinitis due to pollen: Secondary | ICD-10-CM | POA: Diagnosis not present

## 2023-05-20 DIAGNOSIS — G4733 Obstructive sleep apnea (adult) (pediatric): Secondary | ICD-10-CM | POA: Diagnosis not present

## 2023-05-20 DIAGNOSIS — G4761 Periodic limb movement disorder: Secondary | ICD-10-CM | POA: Diagnosis not present

## 2023-06-28 DIAGNOSIS — G4733 Obstructive sleep apnea (adult) (pediatric): Secondary | ICD-10-CM | POA: Diagnosis not present

## 2023-08-07 ENCOUNTER — Encounter: Payer: Self-pay | Admitting: Internal Medicine

## 2023-08-07 ENCOUNTER — Ambulatory Visit: Admitting: Internal Medicine

## 2023-08-07 VITALS — BP 126/86 | HR 83 | Temp 98.2°F | Resp 17 | Ht 73.0 in | Wt 213.8 lb

## 2023-08-07 DIAGNOSIS — M25562 Pain in left knee: Secondary | ICD-10-CM | POA: Insufficient documentation

## 2023-08-07 DIAGNOSIS — S161XXA Strain of muscle, fascia and tendon at neck level, initial encounter: Secondary | ICD-10-CM | POA: Diagnosis not present

## 2023-08-07 MED ORDER — DICLOFENAC SODIUM 75 MG PO TBEC: 75.0000 mg | DELAYED_RELEASE_TABLET | Freq: Two times a day (BID) | ORAL | 0 refills | Status: DC

## 2023-08-07 MED ORDER — CYCLOBENZAPRINE HCL 10 MG PO TABS: 10.0000 mg | ORAL_TABLET | Freq: Three times a day (TID) | ORAL | 0 refills | Status: AC | PRN

## 2023-08-07 NOTE — Assessment & Plan Note (Signed)
 We will start him on diclofenac  and cyclobenzaprine .  Warnings of cyclobenzaprine  discussed.  I want him to use heat on this area.

## 2023-08-07 NOTE — Assessment & Plan Note (Signed)
 We will obtain a xray of his left knee as he is having pain in the joint on exam.

## 2023-08-07 NOTE — Progress Notes (Addendum)
 Office Visit  Subjective   Patient ID: Ryan Shaffer   DOB: Feb 06, 1960   Age: 63 y.o.   MRN: [id number]   Chief Complaint Chief Complaint  Patient presents with   Acute Visit    Pain in neck and left leg     History of Present Illness Ryan Shaffer comes in today for an acute visit for right sided neck pain.  This started a few months ago but last week Ryan Shaffer really noticed pain that was rated a 9 out of 10.  Today, Ryan Shaffer states the pain is now almost gone but the pain is intermittent throbbing pain.  Ryan Shaffer states that Ryan Shaffer didn't hurt himself but Ryan Shaffer does work a physical job with lifting and bending.  Ryan Shaffer has been putting OTC rollon numbing cream and Ryan Shaffer has been using advil.  Ryan Shaffer has tried/ice heat. Today, Ryan Shaffer states there is no pain today. There is no numbness/weakness of his right arm/hand.       Past Medical History Past Medical History:  Diagnosis Date   Benign essential hypertension    BMI 27.0-27.9,adult    CAD (coronary artery disease)    COPD (chronic obstructive pulmonary disease) (HCC)    Coronary artery disease with history of coronary revascularization    Genital warts    Mixed hyperlipidemia    Nicotine dependence    Obstructive sleep apnea, adult      Allergies No Known Allergies   Medications  Current Outpatient Medications:    cyclobenzaprine  (FLEXERIL ) 10 MG tablet, Take 1 tablet (10 mg total) by mouth 3 (three) times daily as needed for muscle spasms., Disp: 15 tablet, Rfl: 0   diclofenac  (VOLTAREN ) 75 MG EC tablet, Take 1 tablet (75 mg total) by mouth 2 (two) times daily for 10 days., Disp: 20 tablet, Rfl: 0   albuterol  (VENTOLIN  HFA) 108 (90 Base) MCG/ACT inhaler, Inhale 2 puffs into the lungs every 4 (four) hours as needed for wheezing or shortness of breath., Disp: 8.5 g, Rfl: 2   aspirin  EC 81 MG tablet, Take 1 tablet (81 mg total) by mouth daily. Swallow whole., Disp: 90 tablet, Rfl: 3   Budeson-Glycopyrrol-Formoterol (BREZTRI  AEROSPHERE) 160-9-4.8 MCG/ACT AERO, Inhale 2  puffs into the lungs in the morning and at bedtime., Disp: 10.7 g, Rfl: 2   buPROPion  (WELLBUTRIN  SR) 150 MG 12 hr tablet, Take one tab po daily x 3 days, then one tab po BID, Disp: 60 tablet, Rfl: 2   carvedilol  (COREG ) 6.25 MG tablet, TAKE 1 TABLET BY MOUTH TWICE A DAY WITH FOOD, Disp: 180 tablet, Rfl: 1   finasteride (PROSCAR) 5 MG tablet, Take 5 mg by mouth daily., Disp: , Rfl:    imiquimod (ALDARA) 5 % cream, Apply 1 application topically 3 (three) times a week., Disp: , Rfl:    lisinopril  (ZESTRIL ) 2.5 MG tablet, TAKE 1 TABLET BY MOUTH EVERY DAY, Disp: 90 tablet, Rfl: 1   montelukast  (SINGULAIR ) 10 MG tablet, TAKE 1 TABLET BY MOUTH EVERY DAY, Disp: 90 tablet, Rfl: 1   nitroGLYCERIN  (NITROSTAT ) 0.4 MG SL tablet, Place 1 tablet (0.4 mg total) under the tongue every 5 (five) minutes as needed for chest pain., Disp: 30 tablet, Rfl: 2   pantoprazole  (PROTONIX ) 40 MG tablet, Take 1 tablet (40 mg total) by mouth daily., Disp: 90 tablet, Rfl: 3   rosuvastatin  (CRESTOR ) 20 MG tablet, Take 1 tablet (20 mg total) by mouth daily., Disp: 30 tablet, Rfl: 11   tamsulosin  (FLOMAX ) 0.4 MG CAPS capsule, Take  1 capsule (0.4 mg total) by mouth daily., Disp: 90 capsule, Rfl: 1   Review of Systems Review of Systems  Constitutional:  Negative for chills and fever.  Respiratory:  Negative for shortness of breath.   Cardiovascular:  Negative for chest pain.  Gastrointestinal:  Negative for abdominal pain, nausea and vomiting.  Musculoskeletal:  Positive for neck pain. Negative for back pain and myalgias.  Neurological:  Negative for dizziness, focal weakness, weakness and headaches.       Objective:    Vitals BP 126/86   Pulse 83   Temp 98.2 F (36.8 C)   Resp 17   Ht 6' 1 (1.854 m)   Wt 213 lb 12.8 oz (97 kg)   SpO2 96%   BMI 28.21 kg/m    Physical Examination Physical Exam Constitutional:      Appearance: Normal appearance. Ryan Shaffer is not ill-appearing.  Cardiovascular:     Rate and Rhythm:  Normal rate and regular rhythm.     Pulses: Normal pulses.     Heart sounds: No murmur heard.    No friction rub. No gallop.  Pulmonary:     Effort: Pulmonary effort is normal. No respiratory distress.     Breath sounds: No wheezing, rhonchi or rales.  Abdominal:     General: Abdomen is flat. Bowel sounds are normal. There is no distension.     Palpations: Abdomen is soft.     Tenderness: There is no abdominal tenderness.  Musculoskeletal:     Right lower leg: No edema.     Left lower leg: No edema.     Comments: Ryan Shaffer has pain upon palpation of his right sternocleidomastoid muscle of his neck.  Skin:    General: Skin is warm and dry.     Findings: No rash.  Neurological:     Mental Status: Ryan Shaffer is alert.     Comments: Strength is 5/5 in both hands/arms.        Assessment & Plan:   Cervical strain We will start him on diclofenac  and cyclobenzaprine .  Warnings of cyclobenzaprine  discussed.  I want him to use heat on this area.  Acute pain of left knee We will obtain a xray of his left knee as Ryan Shaffer is having pain in the joint on exam.    No follow-ups on file.   Selinda Fleeta Finger, MD

## 2023-08-09 ENCOUNTER — Other Ambulatory Visit: Payer: Self-pay | Admitting: Internal Medicine

## 2023-08-15 ENCOUNTER — Encounter: Payer: Self-pay | Admitting: Internal Medicine

## 2023-08-26 ENCOUNTER — Other Ambulatory Visit: Payer: Self-pay | Admitting: Internal Medicine

## 2023-10-18 ENCOUNTER — Other Ambulatory Visit: Payer: Self-pay | Admitting: Internal Medicine

## 2023-11-13 DIAGNOSIS — G4733 Obstructive sleep apnea (adult) (pediatric): Secondary | ICD-10-CM | POA: Diagnosis not present

## 2023-11-13 DIAGNOSIS — J449 Chronic obstructive pulmonary disease, unspecified: Secondary | ICD-10-CM | POA: Diagnosis not present

## 2023-11-13 DIAGNOSIS — J301 Allergic rhinitis due to pollen: Secondary | ICD-10-CM | POA: Diagnosis not present

## 2023-11-13 DIAGNOSIS — F17210 Nicotine dependence, cigarettes, uncomplicated: Secondary | ICD-10-CM | POA: Diagnosis not present

## 2023-11-13 DIAGNOSIS — G4761 Periodic limb movement disorder: Secondary | ICD-10-CM | POA: Diagnosis not present

## 2023-12-18 DIAGNOSIS — R400 Somnolence: Secondary | ICD-10-CM | POA: Diagnosis not present

## 2023-12-18 DIAGNOSIS — L820 Inflamed seborrheic keratosis: Secondary | ICD-10-CM | POA: Diagnosis not present

## 2023-12-18 DIAGNOSIS — L814 Other melanin hyperpigmentation: Secondary | ICD-10-CM | POA: Diagnosis not present

## 2023-12-18 DIAGNOSIS — J449 Chronic obstructive pulmonary disease, unspecified: Secondary | ICD-10-CM | POA: Diagnosis not present

## 2023-12-18 DIAGNOSIS — D485 Neoplasm of uncertain behavior of skin: Secondary | ICD-10-CM | POA: Diagnosis not present

## 2023-12-18 DIAGNOSIS — G4733 Obstructive sleep apnea (adult) (pediatric): Secondary | ICD-10-CM | POA: Diagnosis not present

## 2023-12-18 DIAGNOSIS — J301 Allergic rhinitis due to pollen: Secondary | ICD-10-CM | POA: Diagnosis not present

## 2023-12-22 DIAGNOSIS — G4733 Obstructive sleep apnea (adult) (pediatric): Secondary | ICD-10-CM | POA: Diagnosis not present

## 2024-02-02 ENCOUNTER — Other Ambulatory Visit: Payer: Self-pay

## 2024-02-02 ENCOUNTER — Other Ambulatory Visit: Payer: Self-pay | Admitting: Internal Medicine

## 2024-02-02 DIAGNOSIS — E7800 Pure hypercholesterolemia, unspecified: Secondary | ICD-10-CM

## 2024-02-02 MED ORDER — ROSUVASTATIN CALCIUM 20 MG PO TABS: 20.0000 mg | ORAL_TABLET | Freq: Every day | ORAL | 0 refills | Status: AC

## 2024-02-02 MED ORDER — DICLOFENAC SODIUM 75 MG PO TBEC: 75.0000 mg | DELAYED_RELEASE_TABLET | Freq: Two times a day (BID) | ORAL | 0 refills | Status: AC

## 2024-02-05 ENCOUNTER — Ambulatory Visit: Admitting: Internal Medicine
